# Patient Record
Sex: Female | Born: 1945 | Race: White | Hispanic: No | Marital: Married | Smoking: Never smoker
Health system: Southern US, Community
[De-identification: ages and names within clinical notes are randomized; demographics above are authoritative.]

## PROBLEM LIST (undated history)

## (undated) DIAGNOSIS — E7800 Pure hypercholesterolemia, unspecified: Secondary | ICD-10-CM

## (undated) DIAGNOSIS — E118 Type 2 diabetes mellitus with unspecified complications: Secondary | ICD-10-CM

## (undated) DIAGNOSIS — J189 Pneumonia, unspecified organism: Secondary | ICD-10-CM

## (undated) DIAGNOSIS — G4730 Sleep apnea, unspecified: Secondary | ICD-10-CM

## (undated) DIAGNOSIS — Q839 Congenital malformation of breast, unspecified: Secondary | ICD-10-CM

## (undated) DIAGNOSIS — I2510 Atherosclerotic heart disease of native coronary artery without angina pectoris: Secondary | ICD-10-CM

## (undated) DIAGNOSIS — G43909 Migraine, unspecified, not intractable, without status migrainosus: Secondary | ICD-10-CM

## (undated) DIAGNOSIS — K219 Gastro-esophageal reflux disease without esophagitis: Secondary | ICD-10-CM

## (undated) DIAGNOSIS — G4733 Obstructive sleep apnea (adult) (pediatric): Secondary | ICD-10-CM

## (undated) DIAGNOSIS — E2839 Other primary ovarian failure: Secondary | ICD-10-CM

## (undated) DIAGNOSIS — M810 Age-related osteoporosis without current pathological fracture: Secondary | ICD-10-CM

## (undated) DIAGNOSIS — G4731 Primary central sleep apnea: Secondary | ICD-10-CM

## (undated) DIAGNOSIS — J309 Allergic rhinitis, unspecified: Secondary | ICD-10-CM

## (undated) DIAGNOSIS — E782 Mixed hyperlipidemia: Secondary | ICD-10-CM

## (undated) DIAGNOSIS — F5104 Psychophysiologic insomnia: Secondary | ICD-10-CM

## (undated) DIAGNOSIS — E781 Pure hyperglyceridemia: Secondary | ICD-10-CM

## (undated) DIAGNOSIS — N183 Chronic kidney disease, stage 3 unspecified: Secondary | ICD-10-CM

## (undated) DIAGNOSIS — M1990 Unspecified osteoarthritis, unspecified site: Secondary | ICD-10-CM

## (undated) DIAGNOSIS — M26629 Arthralgia of temporomandibular joint, unspecified side: Secondary | ICD-10-CM

## (undated) DIAGNOSIS — E559 Vitamin D deficiency, unspecified: Secondary | ICD-10-CM

## (undated) DIAGNOSIS — C801 Malignant (primary) neoplasm, unspecified: Secondary | ICD-10-CM

## (undated) DIAGNOSIS — I10 Essential (primary) hypertension: Secondary | ICD-10-CM

## (undated) HISTORY — DX: Type 2 diabetes mellitus with unspecified complications: E11.8

## (undated) HISTORY — DX: Chronic kidney disease, stage 3 unspecified: N18.30

## (undated) HISTORY — PX: ECTOPIC PREGNANCY SURGERY: SHX613

## (undated) HISTORY — DX: Obstructive sleep apnea (adult) (pediatric): G47.33

## (undated) HISTORY — PX: BREAST SURGERY: SHX581

## (undated) HISTORY — DX: Primary central sleep apnea: G47.31

## (undated) HISTORY — DX: Pure hypercholesterolemia, unspecified: E78.00

## (undated) HISTORY — DX: Gastro-esophageal reflux disease without esophagitis: K21.9

## (undated) HISTORY — DX: Pure hyperglyceridemia: E78.1

## (undated) HISTORY — DX: Mixed hyperlipidemia: E78.2

## (undated) HISTORY — DX: Migraine, unspecified, not intractable, without status migrainosus: G43.909

## (undated) HISTORY — DX: Essential (primary) hypertension: I10

## (undated) HISTORY — DX: Psychophysiologic insomnia: F51.04

## (undated) HISTORY — PX: BLADDER SURGERY: SHX569

## (undated) HISTORY — DX: Age-related osteoporosis without current pathological fracture: M81.0

## (undated) HISTORY — DX: Vitamin D deficiency, unspecified: E55.9

## (undated) HISTORY — DX: Congenital malformation of breast, unspecified: Q83.9

## (undated) HISTORY — DX: Atherosclerotic heart disease of native coronary artery without angina pectoris: I25.10

## (undated) HISTORY — DX: Allergic rhinitis, unspecified: J30.9

## (undated) HISTORY — PX: EYE SURGERY: SHX253

## (undated) HISTORY — DX: Other primary ovarian failure: E28.39

## (undated) HISTORY — PX: BREAST REDUCTION SURGERY: SHX8

## (undated) HISTORY — DX: Arthralgia of temporomandibular joint, unspecified side: M26.629

---

## 2009-08-09 ENCOUNTER — Emergency Department (HOSPITAL_COMMUNITY): Admission: EM | Admit: 2009-08-09 | Discharge: 2009-08-09 | Payer: Self-pay | Admitting: Emergency Medicine

## 2011-03-10 LAB — BASIC METABOLIC PANEL
Chloride: 104 mEq/L (ref 96–112)
GFR calc Af Amer: >60 mL/min (ref >60)
Potassium: 4.2 mEq/L (ref 3.5–5.1)

## 2011-03-10 LAB — DIFFERENTIAL
Eosinophils Absolute: 0.2 10*3/uL (ref 0.0–0.7)
Eosinophils Relative: 2 % (ref 0–5)
Lymphs Abs: 4 10*3/uL (ref 0.7–4.0)
Monocytes Absolute: 0.5 10*3/uL (ref 0.1–1.0)
Monocytes Relative: 6 % (ref 3–12)

## 2011-03-10 LAB — CBC
HCT: 40.5 % (ref 36.0–46.0)
Hemoglobin: 14 g/dL (ref 12.0–15.0)
MCV: 90 fL (ref 78.0–100.0)
RBC: 4.51 MIL/uL (ref 3.87–5.11)
WBC: 8.8 10*3/uL (ref 4.0–10.5)

## 2011-03-10 LAB — URINALYSIS, ROUTINE W REFLEX MICROSCOPIC
Glucose, UA: NEGATIVE mg/dL
Hgb urine dipstick: NEGATIVE
Ketones, ur: NEGATIVE mg/dL
Protein, ur: NEGATIVE mg/dL

## 2013-01-15 ENCOUNTER — Ambulatory Visit: Payer: Self-pay | Admitting: Gastroenterology

## 2013-01-15 ENCOUNTER — Telehealth: Payer: Self-pay | Admitting: Gastroenterology

## 2013-01-15 NOTE — Telephone Encounter (Signed)
Pt was a no show

## 2013-02-06 ENCOUNTER — Ambulatory Visit (INDEPENDENT_AMBULATORY_CARE_PROVIDER_SITE_OTHER): Payer: Medicare Other | Admitting: Gastroenterology

## 2013-02-06 ENCOUNTER — Encounter: Payer: Self-pay | Admitting: Gastroenterology

## 2013-02-06 VITALS — BP 124/72 | HR 66 | Temp 98.4°F | Ht 61.0 in | Wt 138.4 lb

## 2013-02-06 DIAGNOSIS — K219 Gastro-esophageal reflux disease without esophagitis: Secondary | ICD-10-CM

## 2013-02-06 DIAGNOSIS — R1310 Dysphagia, unspecified: Secondary | ICD-10-CM | POA: Insufficient documentation

## 2013-02-06 DIAGNOSIS — Z1211 Encounter for screening for malignant neoplasm of colon: Secondary | ICD-10-CM

## 2013-02-06 MED ORDER — PEG 3350-KCL-NA BICARB-NACL 420 G PO SOLR: 4000.0000 mL | ORAL | Status: DC

## 2013-02-06 NOTE — Patient Instructions (Addendum)
We have scheduled you for a colonoscopy and upper endoscopy with dilation if needed with Dr. Jena Gauss.  Start taking Prilosec each morning, 30 minutes before breakfast.

## 2013-02-06 NOTE — Progress Notes (Signed)
Primary Care Physician:  Craig Staggers, MD Primary Gastroenterologist:  Dr. Jena Gauss   Chief Complaint  Patient presents with  . Colonoscopy    HPI:   [name] is a pleasant 67 year old female who is a self-referral today to schedule a colonoscopy and possible upper endoscopy. She states her last lower GI evaluation was in [place] at least 11 years ago. States she had a torturous colon, internal hemorrhoid. No polyps. She also underwent an EGD at that time due to GERD. States had gastric polyps. History of GERD but no problems currently. Prilosec prn. Occasional solid food dysphagia. No N/V. No abdominal pain. Occasional constipation. Takes a stool softener as needed. States this is a chronic problem. Possible scant hematochezia in the remote past.    Past Medical History  Diagnosis Date  . Hypertension   . Hypercholesterolemia   . GERD (gastroesophageal reflux disease)   . Central sleep apnea     Past Surgical History  Procedure Laterality Date  . Breast reduction surgery    . Ectopic pregnancy surgery    . Vaginal hysterectomy    . Bladder surgery      Current Outpatient Prescriptions  Medication Sig Dispense Refill  . aspirin 81 MG tablet Take 81 mg by mouth daily.      Marland Kitchen atorvastatin (LIPITOR) 20 MG tablet Take 20 mg by mouth daily.       . calcium-vitamin D (OSCAL WITH D) 500-200 MG-UNIT per tablet Take 1 tablet by mouth daily.      . fish oil-omega-3 fatty acids 1000 MG capsule Take 2 g by mouth daily.      Marland Kitchen losartan (COZAAR) 100 MG tablet Take 100 mg by mouth daily.       Marland Kitchen omeprazole (PRILOSEC) 20 MG capsule Take 20 mg by mouth daily.      Marland Kitchen PREMARIN 0.625 MG tablet Take 0.625 mg by mouth daily.        No current facility-administered medications for this visit.    Allergies as of 02/06/2013  . (No Known Allergies)    Family History  Problem Relation Age of Onset  . Colon cancer Neg Hx     History   Social History  . Marital Status: Married   Spouse Name: N/A    Number of Children: N/A  . Years of Education: N/A   Occupational History  . retired     Interior and spatial designer   Social History Main Topics  . Smoking status: Never Smoker   . Smokeless tobacco: Not on file  . Alcohol Use: Yes     Comment: red wine very little  . Drug Use: No  . Sexually Active: Not on file   Other Topics Concern  . Not on file   Social History Narrative  . No narrative on file    Review of Systems: Gen: Denies any fever, chills, fatigue, weight loss, lack of appetite.  CV: Denies chest pain, heart palpitations, peripheral edema, syncope.  Resp: Denies shortness of breath at rest or with exertion. Denies wheezing or cough.  GI: Denies dysphagia or odynophagia. Denies jaundice, hematemesis, fecal incontinence. GU : Denies urinary burning, urinary frequency, urinary hesitancy MS: Denies joint pain, muscle weakness, cramps, or limitation of movement.  Derm: Denies rash, itching, dry skin Psych: Denies depression, anxiety, memory loss, and confusion Heme: Denies bruising, bleeding, and enlarged lymph nodes.  Physical Exam: BP 124/72  Pulse 66  Temp(Src) 98.4 F (36.9 C) (Oral)  Ht 5\' 1"  (1.549 m)  Wt 138 lb  6.4 oz (62.778 kg)  BMI 26.16 kg/m2 General:   Alert and oriented. Pleasant and cooperative. Well-nourished and well-developed.  Head:  Normocephalic and atraumatic. Eyes:  Without icterus, sclera clear and conjunctiva pink.  Ears:  Normal auditory acuity. Nose:  No deformity, discharge,  or lesions. Mouth:  No deformity or lesions, oral mucosa pink.  Neck:  Supple, without mass or thyromegaly. Lungs:  Clear to auscultation bilaterally. No wheezes, rales, or rhonchi. No distress.  Heart:  S1, S2 present without murmurs appreciated.  Abdomen:  +BS, soft, non-tender and non-distended. No HSM noted. No guarding or rebound. No masses appreciated.  Rectal:  Deferred  Msk:  Symmetrical without gross deformities. Normal posture.. Extremities:   Without clubbing or edema. Neurologic:  Alert and  oriented x4;  grossly normal neurologically. Skin:  Intact without significant lesions or rashes. Cervical Nodes:  No significant cervical adenopathy. Psych:  Alert and cooperative. Normal mood and affect.

## 2013-02-07 NOTE — Patient Instructions (Addendum)
Sandra Leblanc  02/07/2013   Your procedure is scheduled on:   3/13/204  Report to [facility] at  730  AM.  Call this number if you have problems the morning of surgery: [phone]   Remember:   Do not eat food or drink liquids after midnight.   Take these medicines the morning of surgery with A SIP OF WATER: cozaar,prilosec   Do not wear jewelry, make-up or nail polish.  Do not wear lotions, powders, or perfumes.   Do not shave 48 hours prior to surgery. Men may shave face and neck.  Do not bring valuables to the hospital.  Contacts, dentures or bridgework may not be worn into surgery.  Leave suitcase in the car. After surgery it may be brought to your room.  For patients admitted to the hospital, checkout time is 11:00 AM the day of discharge.   Patients discharged the day of surgery will not be allowed to drive  home.  Name and phone number of your driver: family  Special Instructions: N/A   Please read over the following fact sheets that you were given: Pain Booklet, Coughing and Deep Breathing, Surgical Site Infection Prevention, Anesthesia Post-op Instructions and Care and Recovery After Surgery Esophageal Dilatation The esophagus is the long, narrow tube which carries food and liquid from the mouth to the stomach. Esophageal dilatation is the technique used to stretch a blocked or narrowed portion of the esophagus. This procedure is used when a part of the esophagus has become so narrow that it becomes difficult, painful or even impossible to swallow. This is generally an uncomplicated form of treatment. When this is not successful, chest surgery may be required. This is a much more extensive form of treatment with a longer recovery time. CAUSES  Some of the more common causes of blockage or strictures of the esophagus are:  Narrowing from longstanding inflammation (soreness and redness) of the lower esophagus. This comes from the constant exposure of the lower  esophagus to the acid which bubbles up from the stomach. Over time this causes scarring and narrowing of the lower esophagus.  Hiatal hernia in which a small part of the stomach bulges (herniates) up through the diaphragm. This can cause a gradual narrowing of the end of the esophagus.  Schatzki's Ring is a narrow ring of benign (non-cancerous) fibrous tissue which constricts the lower esophagus. The reason for this is not known.  Scleroderma is a connective tissue disorder that affects the esophagus and makes swallowing difficult.  Achalasia is an absence of nerves to the lower esophagus and to the esophageal sphincter. This is the circular muscle between the stomach and esophagus that relaxes to allow food into the stomach. After swallowing, it contracts to keep food in the stomach. This absence of nerves may be congenital (present since birth). This can cause irregular spasms of the lower esophageal muscle. This spasm does not open up to allow food and fluid through. The result is a persistent blockage with subsequent slow trickling of the esophageal contents into the stomach.  Strictures may develop from swallowing materials which damage the esophagus. Some examples are strong acids or alkalis such as lye.  Growths such as benign (non-cancerous) and malignant (cancerous) tumors can block the esophagus.  Heredity (present since birth) causes. DIAGNOSIS  Your caregiver often suspects this problem by taking a medical history. They will also do a physical exam. They can then prove their suspicions using  X-rays and endoscopy. Endoscopy is an exam in which a tube like a small flexible telescope is used to look at your esophagus.  TREATMENT There are different stretching (dilating) techniques which can be used. Simple bougie dilatation may be done in the office. This usually takes only a couple minutes. A numbing (anesthetic) spray of the throat is used. Endoscopy, when done, is done in an endoscopy  suite, under mild sedation. When fluoroscopy is used, the procedure is performed in X-ray. Other techniques require a little longer time. Recovery is usually quick. There is no waiting time to begin eating and drinking to test success of the treatment. Following are some of the methods used. Narrowing of the esophagus is treated by making it bigger. Commonly this is a mechanical problem which can be treated with stretching. This can be done in different ways. Your caregiver will discuss these with you. Some of the means used are:  A series of graduated (increasing thickness) flexible dilators can be used. These are weighted tubes passed through the esophagus into the stomach. The tubes used become progressively larger until the desired stretched size is reached. Graduated dilators are a simple and quick way of opening the esophagus. No visualization is required.  Another method is the use of endoscopy to place a flexible wire across the stricture. The endoscope is removed and the wire left in place. A dilator with a hole through it from end to end is guided down the esophagus and across the stricture. One or more of these dilators are passed over the wire. At the end of the exam, the wire is removed. This type of treatment may be performed in the X-ray department under fluoroscopy. An advantage of this procedure is the examiner is visualizing the end opening in the esophagus.  Stretching of the esophagus may be done using balloons. Deflated balloons are placed through the endoscope and across the stricture. This type of balloon dilatation is often done at the time of endoscopy or fluoroscopy. Flexible endoscopy allows the examiner to directly view the stricture. A balloon is inserted in the deflated form into the area of narrowing. It is then inflated with air to a certain pressure that is pre-set for a given circumference. When inflated, it becomes sausage shaped, stretched, and makes the stricture  larger.  Achalasia requires a longer larger balloon-type dilator. This is frequently done under X-ray control. In this situation, the spastic muscle fibers in the lower esophagus are stretched. All of the above procedures make the passage of food and water into the stomach easier. They also make it easier for stomach contents to reflux back into the esophagus. Special medications may be used following the procedure to help prevent further stricturing. Proton-pump inhibitor medications are good at decreasing the amount of acid in the stomach juice. When stomach juice refluxes into the esophagus, the juice is no longer as acidic and is less likely to burn or scar the esophagus. RISKS AND COMPLICATIONS Esophageal dilatation is usually performed effectively and without problems. Some complications that can occur are:  A small amount of bleeding almost always happens where the stretching takes place. If this is too excessive it may require more aggressive treatment.  An uncommon complication is perforation (making a hole) of the esophagus. The esophagus is thin. It is easy to make a hole in it. If this happens, an operation may be necessary to repair this.  A small, undetected perforation could lead to an infection in the chest. This can  be very serious. HOME CARE INSTRUCTIONS   If you received sedation for your procedure, do not drive, make important decisions, or perform any activities requiring your full coordination. Do not drink alcohol, take sedatives, or use any mind altering chemicals unless instructed by your caregiver.  You may use throat lozenges or warm salt water gargles if you have throat discomfort  You can begin eating and drinking normally on return home unless instructed otherwise. Do not purposely try to force large chunks of food down to test the benefits of your procedure.  Mild discomfort can be eased with sips of ice water.  Medications for discomfort may or may not be  needed. SEEK IMMEDIATE MEDICAL CARE IF:   You begin vomiting up blood.  You develop black tarry stools  You develop chills or an unexplained temperature of over 101 F (38.3 C)  You develop chest or abdominal pain.  You develop shortness of breath or feel lightheaded or faint.  Your swallowing is becoming more painful, difficult, or you are unable to swallow. MAKE SURE YOU:   Understand these instructions.  Will watch your condition.  Will get help right away if you are not doing well or get worse. Document Released: 01/11/2006 Document Revised: 02/12/2012 Document Reviewed: 02/28/2006 [facility] Patient Information 2013 [place], [place]. Colonoscopy A colonoscopy is an exam to evaluate your entire colon. In this exam, your colon is cleansed. A long fiberoptic tube is inserted through your rectum and into your colon. The fiberoptic scope (endoscope) is a long bundle of enclosed and very flexible fibers. These fibers transmit light to the area examined and send images from that area to your caregiver. Discomfort is usually minimal. You may be given a drug to help you sleep (sedative) during or prior to the procedure. This exam helps to detect lumps (tumors), polyps, inflammation, and areas of bleeding. Your caregiver may also take a small piece of tissue (biopsy) that will be examined under a microscope. LET YOUR CAREGIVER KNOW ABOUT:   Allergies to food or medicine.  Medicines taken, including vitamins, herbs, eyedrops, over-the-counter medicines, and creams.  Use of steroids (by mouth or creams).  Previous problems with anesthetics or numbing medicines.  History of bleeding problems or blood clots.  Previous surgery.  Other health problems, including diabetes and kidney problems.  Possibility of pregnancy, if this applies. BEFORE THE PROCEDURE   A clear liquid diet may be required for 2 days before the exam.  Ask your caregiver about changing or stopping your regular  medications.  Liquid injections (enemas) or laxatives may be required.  A large amount of electrolyte solution may be given to you to drink over a short period of time. This solution is used to clean out your colon.  You should be present 60 minutes prior to your procedure or as directed by your caregiver. AFTER THE PROCEDURE   If you received a sedative or pain relieving medication, you will need to arrange for someone to drive you home.  Occasionally, there is a little blood passed with the first bowel movement. Do not be concerned. FINDING OUT THE RESULTS OF YOUR TEST Not all test results are available during your visit. If your test results are not back during the visit, make an appointment with your caregiver to find out the results. Do not assume everything is normal if you have not heard from your caregiver or the medical facility. It is important for you to follow up on all of your test results. HOME  CARE INSTRUCTIONS   It is not unusual to pass moderate amounts of gas and experience mild abdominal cramping following the procedure. This is due to air being used to inflate your colon during the exam. Walking or a warm pack on your belly (abdomen) may help.  You may resume all normal meals and activities after sedatives and medicines have worn off.  Only take over-the-counter or prescription medicines for pain, discomfort, or fever as directed by your caregiver. Do not use aspirin or blood thinners if a biopsy was taken. Consult your caregiver for medicine usage if biopsies were taken. SEEK IMMEDIATE MEDICAL CARE IF:   You have a fever.  You pass large blood clots or fill a toilet with blood following the procedure. This may also occur 10 to 14 days following the procedure. This is more likely if a biopsy was taken.  You develop abdominal pain that keeps getting worse and cannot be relieved with medicine. Document Released: 11/17/2000 Document Revised: 02/12/2012 Document Reviewed:  07/02/2008 [facility] Patient Information 2013 [place], [place]. Esophagogastroduodenoscopy This is an endoscopic procedure (a procedure that uses a device like a flexible telescope) that allows your caregiver to view the upper stomach and small bowel. This test allows your caregiver to look at the esophagus. The esophagus carries food from your mouth to your stomach. They can also look at your duodenum. This is the first part of the small intestine that attaches to the stomach. This test is used to detect problems in the bowel such as ulcers and inflammation. PREPARATION FOR TEST Nothing to eat after midnight the day before the test. NORMAL FINDINGS Normal esophagus, stomach, and duodenum. Ranges for normal findings may vary among different laboratories and hospitals. You should always check with your doctor after having lab work or other tests done to discuss the meaning of your test results and whether your values are considered within normal limits. MEANING OF TEST  Your caregiver will go over the test results with you and discuss the importance and meaning of your results, as well as treatment options and the need for additional tests if necessary. OBTAINING THE TEST RESULTS It is your responsibility to obtain your test results. Ask the lab or department performing the test when and how you will get your results. Document Released: 03/23/2005 Document Revised: 02/12/2012 Document Reviewed: 10/30/2008 [facility] Patient Information 2013 [place], [place]. PATIENT INSTRUCTIONS POST-ANESTHESIA  IMMEDIATELY FOLLOWING SURGERY:  Do not drive or operate machinery for the first twenty four hours after surgery.  Do not make any important decisions for twenty four hours after surgery or while taking narcotic pain medications or sedatives.  If you develop intractable nausea and vomiting or a severe headache please notify your doctor immediately.  FOLLOW-UP:  Please make an appointment with your surgeon as  instructed. You do not need to follow up with anesthesia unless specifically instructed to do so.  WOUND CARE INSTRUCTIONS (if applicable):  Keep a dry clean dressing on the anesthesia/puncture wound site if there is drainage.  Once the wound has quit draining you may leave it open to air.  Generally you should leave the bandage intact for twenty four hours unless there is drainage.  If the epidural site drains for more than 36-48 hours please call the anesthesia department.  QUESTIONS?:  Please feel free to call your physician or the hospital operator if you have any questions, and they will be happy to assist you.

## 2013-02-07 NOTE — Assessment & Plan Note (Signed)
67 year old pleasant female who is due for routine screening colonoscopy. Her last lower GI evaluation was in [place] at least 11 years ago. We will attempt to retrieve this records. According to her, she notes a torturous colon, no polyps, internal hemorrhoids. She states she was awake during the procedure and experienced a lot of pain. She is requesting to be completely sedated due to history of failed sedation.   Proceed with TCS with Dr. Jena Gauss in near future: the risks, benefits, and alternatives have been discussed with the patient in detail. The patient states understanding and desires to proceed. Propofol due to history of failed sedation

## 2013-02-07 NOTE — Assessment & Plan Note (Addendum)
Solid-food dysphagia noted, no prior dilations that are known. Her last EGD was at least 11 years ago at the time of TCS. Notes she had gastric polyps at that time. May be dealing with uncontrolled GERD, need to evaluate for occult web, ring, or stricture.   Proceed with upper endoscopy and dilation in the near future with Dr. Jena Gauss. The risks, benefits, and alternatives have been discussed in detail with patient. They have stated understanding and desire to proceed.  Propofol due to history of failed sedation.

## 2013-02-07 NOTE — Assessment & Plan Note (Signed)
Chronic, only taking Prilosec prn. Start taking daily. See dysphagia.

## 2013-02-10 ENCOUNTER — Other Ambulatory Visit: Payer: Self-pay

## 2013-02-10 ENCOUNTER — Encounter (HOSPITAL_COMMUNITY): Payer: Self-pay | Admitting: Pharmacy Technician

## 2013-02-10 ENCOUNTER — Encounter (HOSPITAL_COMMUNITY)
Admission: RE | Admit: 2013-02-10 | Discharge: 2013-02-10 | Disposition: A | Discharge status: Home / Self Care | Payer: Medicare Other | Source: Ambulatory Visit | Attending: Internal Medicine | Admitting: Internal Medicine

## 2013-02-10 ENCOUNTER — Encounter (HOSPITAL_COMMUNITY): Payer: Self-pay

## 2013-02-10 HISTORY — DX: Sleep apnea, unspecified: G47.30

## 2013-02-10 HISTORY — DX: Unspecified osteoarthritis, unspecified site: M19.90

## 2013-02-10 LAB — BASIC METABOLIC PANEL
BUN: 14 mg/dL (ref 6–23)
Chloride: 101 mEq/L (ref 96–112)
GFR calc Af Amer: >90 mL/min (ref >90)
Potassium: 4.3 mEq/L (ref 3.5–5.1)

## 2013-02-10 LAB — HEMOGLOBIN AND HEMATOCRIT, BLOOD: Hemoglobin: 13.6 g/dL (ref 12.0–15.0)

## 2013-02-10 NOTE — Progress Notes (Signed)
Faxed to PCP

## 2013-02-13 ENCOUNTER — Encounter (HOSPITAL_COMMUNITY)
Admission: RE | Disposition: A | Discharge status: Home / Self Care | Payer: Self-pay | Source: Ambulatory Visit | Attending: Internal Medicine

## 2013-02-13 ENCOUNTER — Encounter (HOSPITAL_COMMUNITY): Payer: Self-pay | Admitting: *Deleted

## 2013-02-13 ENCOUNTER — Ambulatory Visit (HOSPITAL_COMMUNITY): Payer: Medicare Other | Admitting: Anesthesiology

## 2013-02-13 ENCOUNTER — Encounter (HOSPITAL_COMMUNITY): Payer: Self-pay | Admitting: Anesthesiology

## 2013-02-13 ENCOUNTER — Ambulatory Visit (HOSPITAL_COMMUNITY)
Admission: RE | Admit: 2013-02-13 | Discharge: 2013-02-13 | Disposition: A | Discharge status: Home / Self Care | Payer: Medicare Other | Source: Ambulatory Visit | Attending: Internal Medicine | Admitting: Internal Medicine

## 2013-02-13 DIAGNOSIS — D126 Benign neoplasm of colon, unspecified: Secondary | ICD-10-CM

## 2013-02-13 DIAGNOSIS — Z1211 Encounter for screening for malignant neoplasm of colon: Secondary | ICD-10-CM

## 2013-02-13 DIAGNOSIS — K5730 Diverticulosis of large intestine without perforation or abscess without bleeding: Secondary | ICD-10-CM | POA: Insufficient documentation

## 2013-02-13 DIAGNOSIS — Z01810 Encounter for preprocedural cardiovascular examination: Secondary | ICD-10-CM | POA: Insufficient documentation

## 2013-02-13 DIAGNOSIS — Z01812 Encounter for preprocedural laboratory examination: Secondary | ICD-10-CM | POA: Insufficient documentation

## 2013-02-13 DIAGNOSIS — R1310 Dysphagia, unspecified: Secondary | ICD-10-CM

## 2013-02-13 DIAGNOSIS — I10 Essential (primary) hypertension: Secondary | ICD-10-CM | POA: Insufficient documentation

## 2013-02-13 DIAGNOSIS — D131 Benign neoplasm of stomach: Secondary | ICD-10-CM

## 2013-02-13 DIAGNOSIS — K2100 Gastro-esophageal reflux disease with esophagitis, without bleeding: Secondary | ICD-10-CM | POA: Insufficient documentation

## 2013-02-13 HISTORY — PX: MALONEY DILATION: SHX5535

## 2013-02-13 HISTORY — PX: ESOPHAGOGASTRODUODENOSCOPY (EGD) WITH PROPOFOL: SHX5813

## 2013-02-13 HISTORY — PX: COLONOSCOPY WITH PROPOFOL: SHX5780

## 2013-02-13 SURGERY — COLONOSCOPY WITH PROPOFOL
Anesthesia: Monitor Anesthesia Care

## 2013-02-13 MED ORDER — LACTATED RINGERS IV SOLN
INTRAVENOUS | Status: DC
  Administered 2013-02-13: 09:00:00 via INTRAVENOUS

## 2013-02-13 MED ORDER — PROPOFOL INFUSION 10 MG/ML OPTIME
INTRAVENOUS | Status: DC | PRN
  Administered 2013-02-13: 75 ug/kg/min via INTRAVENOUS

## 2013-02-13 MED ORDER — MIDAZOLAM HCL 2 MG/2ML IJ SOLN
1.0000 mg | INTRAMUSCULAR | Status: DC | PRN
Start: 2013-02-13 — End: 2013-02-13
  Administered 2013-02-13: 2 mg via INTRAVENOUS

## 2013-02-13 MED ORDER — STERILE WATER FOR IRRIGATION IR SOLN
Status: DC | PRN
  Administered 2013-02-13: 10:00:00

## 2013-02-13 MED ORDER — FENTANYL CITRATE 0.05 MG/ML IJ SOLN: 25.0000 ug | INTRAMUSCULAR | Status: DC | PRN

## 2013-02-13 MED ORDER — ONDANSETRON HCL 4 MG/2ML IJ SOLN: 4.0000 mg | Freq: Once | INTRAMUSCULAR | Status: DC | PRN

## 2013-02-13 MED ORDER — MIDAZOLAM HCL 2 MG/2ML IJ SOLN
INTRAMUSCULAR | Status: AC
  Filled 2013-02-13: qty 2

## 2013-02-13 MED ORDER — MIDAZOLAM HCL 5 MG/5ML IJ SOLN
INTRAMUSCULAR | Status: DC | PRN
  Administered 2013-02-13 (×2): 1 mg via INTRAVENOUS

## 2013-02-13 MED ORDER — GLYCOPYRROLATE 0.2 MG/ML IJ SOLN
0.2000 mg | Freq: Once | INTRAMUSCULAR | Status: AC
  Administered 2013-02-13: 0.2 mg via INTRAVENOUS

## 2013-02-13 MED ORDER — ONDANSETRON HCL 4 MG/2ML IJ SOLN
4.0000 mg | Freq: Once | INTRAMUSCULAR | Status: AC
  Administered 2013-02-13: 4 mg via INTRAVENOUS

## 2013-02-13 MED ORDER — BUTAMBEN-TETRACAINE-BENZOCAINE 2-2-14 % EX AERO
1.0000 | INHALATION_SPRAY | Freq: Once | CUTANEOUS | Status: AC
  Administered 2013-02-13: 1 via TOPICAL
  Filled 2013-02-13: qty 56

## 2013-02-13 MED ORDER — GLYCOPYRROLATE 0.2 MG/ML IJ SOLN
INTRAMUSCULAR | Status: AC
  Filled 2013-02-13: qty 1

## 2013-02-13 MED ORDER — FENTANYL CITRATE 0.05 MG/ML IJ SOLN
INTRAMUSCULAR | Status: DC | PRN
  Administered 2013-02-13: 50 ug via INTRAVENOUS
  Administered 2013-02-13 (×2): 25 ug via INTRAVENOUS

## 2013-02-13 MED ORDER — WATER FOR IRRIGATION, STERILE IR SOLN
Status: DC | PRN
  Administered 2013-02-13: 1000 mL

## 2013-02-13 MED ORDER — ONDANSETRON HCL 4 MG/2ML IJ SOLN
INTRAMUSCULAR | Status: AC
  Filled 2013-02-13: qty 2

## 2013-02-13 MED ORDER — PROPOFOL 10 MG/ML IV EMUL
INTRAVENOUS | Status: AC
  Filled 2013-02-13: qty 20

## 2013-02-13 SURGICAL SUPPLY — 26 items
BLOCK BITE 60FR ADLT L/F BLUE (MISCELLANEOUS) ×2 IMPLANT
DEVICE CLIP HEMOSTAT 235CM (CLIP) IMPLANT
ELECT REM PT RETURN 9FT ADLT (ELECTROSURGICAL)
ELECTRODE REM PT RTRN 9FT ADLT (ELECTROSURGICAL) IMPLANT
FCP BXJMBJMB 240X2.8X (CUTTING FORCEPS)
FLOOR PAD 36X40 (MISCELLANEOUS) ×2
FORCEP RJ3 GP 1.8X160 W-NEEDLE (CUTTING FORCEPS) ×1 IMPLANT
FORCEPS BIOP RAD 4 LRG CAP 4 (CUTTING FORCEPS) ×3 IMPLANT
FORCEPS BIOP RJ4 240 W/NDL (CUTTING FORCEPS)
FORCEPS BXJMBJMB 240X2.8X (CUTTING FORCEPS) IMPLANT
INJECTOR/SNARE I SNARE (MISCELLANEOUS) IMPLANT
LUBRICANT JELLY 4.5OZ STERILE (MISCELLANEOUS) ×1 IMPLANT
MANIFOLD NEPTUNE II (INSTRUMENTS) ×1 IMPLANT
MANIFOLD NEPTUNE WASTE (CANNULA) IMPLANT
NDL SCLEROTHERAPY 25GX240 (NEEDLE) ×1 IMPLANT
NEEDLE SCLEROTHERAPY 25GX240 (NEEDLE) ×2 IMPLANT
PAD FLOOR 36X40 (MISCELLANEOUS) ×1 IMPLANT
PROBE APC STR FIRE (PROBE) ×2 IMPLANT
PROBE INJECTION GOLD (MISCELLANEOUS) ×2
PROBE INJECTION GOLD 7FR (MISCELLANEOUS) ×1 IMPLANT
SNARE ROTATE MED OVAL 20MM (MISCELLANEOUS) ×2 IMPLANT
SYR 50ML LL SCALE MARK (SYRINGE) ×1 IMPLANT
TRAP SPECIMEN MUCOUS 40CC (MISCELLANEOUS) IMPLANT
TUBING ENDO SMARTCAP PENTAX (MISCELLANEOUS) ×2 IMPLANT
TUBING IRRIGATION ENDOGATOR (MISCELLANEOUS) ×2 IMPLANT
WATER STERILE IRR 1000ML POUR (IV SOLUTION) ×2 IMPLANT

## 2013-02-13 NOTE — Interval H&P Note (Signed)
History and Physical Interval Note:  02/13/2013 9:22 AM  Sandra Leblanc  has presented today for surgery, with the diagnosis of Screening TCS , GERD, Dysphagia Done in the OR  The various methods of treatment have been discussed with the patient and family. After consideration of risks, benefits and other options for treatment, the patient has consented to  Procedure(s) with comments: COLONOSCOPY WITH PROPOFOL (N/A) - 9:00 ESOPHAGOGASTRODUODENOSCOPY (EGD) WITH PROPOFOL (N/A) SAVORY DILATION (N/A) MALONEY DILATION (N/A) as a surgical intervention .  The patient's history has been reviewed, patient examined, no change in status, stable for surgery.  I have reviewed the patient's chart and labs.  Questions were answered to the patient's satisfaction.     Robert Rourk EGD and colonoscopy per plan.The risks, benefits, limitations, imponderables and alternatives regarding both EGD and colonoscopy have been reviewed with the patient. Questions have been answered. All parties agreeable.

## 2013-02-13 NOTE — Op Note (Addendum)
[facility] [address] [place], [address]   ENDOSCOPY PROCEDURE REPORT  PATIENT: Sandra Leblanc, Sandra Leblanc  MR#: [id number] BIRTHDATE: February 11, 1946 , 66  yrs. old GENDER: Female ENDOSCOPIST: R.  Roetta Sessions, MD FACP [facility] REFERRED BY:  Dr. Kandice Moos PROCEDURE DATE:  02/13/2013 PROCEDURE:     EGD with Elease Hashimoto dilation followed by gastric biopsy  INDICATIONS:     History of GERD; esophageal dysphagia  INFORMED CONSENT:   The risks, benefits, limitations, alternatives and imponderables have been discussed.  The potential for biopsy, esophogeal dilation, etc. have also been reviewed.  Questions have been answered.  All parties agreeable.  Please see the history and physical in the medical record for more information.  MEDICATIONS:    deep sedation per Dr. [facility]  DESCRIPTION OF PROCEDURE:   The     endoscope was introduced through the mouth and advanced to the second portion of the duodenum without difficulty or limitations.  The mucosal surfaces were surveyed very carefully during advancement of the scope and upon withdrawal.  Retroflexion view of the proximal stomach and esophagogastric junction was performed.      FINDINGS: Couple of tiny distal esophageal erosions within 5 mm of the GE junction. Patent tubular esophagus throughout its course. No Barrett's esophagus. Stomach empty. Small hiatal hernia. Multiple 2-5 mm benign-appearing gastric polyps; Otherwise, the remainder the gastric mucosa appeared normal. Patent pylorus. Normal first and second portion of the duodenum  THERAPEUTIC / DIAGNOSTIC MANEUVERS PERFORMED:  A 54 French Maloney dilator was passed to full insertion easily. A look back revealed no apparent complication related to this maneuver. Subsequently, biopsies of one of the gastric polyps taken for histologic study.   COMPLICATIONS:  None  IMPRESSION:  Mild erosive reflux esophagitis; Status post passage of a Maloney dilator.  Hiatal hernia. Benign-appearing gastric polyps-status post biopsy  RECOMMENDATIONS:   Increase omeprazole to 40 mg daily for now. Followup on pathology. See colonoscopy report.    _______________________________ R. Roetta Sessions, MD FACP [facility] eSigned:  R. Roetta Sessions, MD FACP [facility] 02/13/2013 10:46 AM     CC:

## 2013-02-13 NOTE — Anesthesia Preprocedure Evaluation (Signed)
Anesthesia Evaluation  Patient identified by MRN, date of birth, ID band Patient awake    Reviewed: Allergy & Precautions, H&P , NPO status , Patient's Chart, lab work & pertinent test results  History of Anesthesia Complications Negative for: history of anesthetic complications  Airway Mallampati: II TM Distance: >3 FB     Dental  (+) Teeth Intact   Pulmonary sleep apnea and Continuous Positive Airway Pressure Ventilation ,  breath sounds clear to auscultation        Cardiovascular hypertension, Pt. on medications Rhythm:Regular Rate:Normal     Neuro/Psych    GI/Hepatic GERD-  Medicated and Controlled,  Endo/Other    Renal/GU      Musculoskeletal   Abdominal   Peds  Hematology   Anesthesia Other Findings   Reproductive/Obstetrics                           Anesthesia Physical Anesthesia Plan  ASA: II  Anesthesia Plan: MAC   Post-op Pain Management:    Induction: Intravenous  Airway Management Planned: Simple Face Mask  Additional Equipment:   Intra-op Plan:   Post-operative Plan:   Informed Consent: I have reviewed the patients History and Physical, chart, labs and discussed the procedure including the risks, benefits and alternatives for the proposed anesthesia with the patient or authorized representative who has indicated his/her understanding and acceptance.     Plan Discussed with:   Anesthesia Plan Comments:         Anesthesia Quick Evaluation

## 2013-02-13 NOTE — Transfer of Care (Signed)
Immediate Anesthesia Transfer of Care Note  Patient: Sandra Leblanc  Procedure(s) Performed: Procedure(s) with comments: COLONOSCOPY WITH PROPOFOL (N/A) - start [phone]; in cecum at 1003 ; out of cecum at 1020  ; total time = 17 minutes ESOPHAGOGASTRODUODENOSCOPY (EGD) WITH PROPOFOL (N/A) - end 0948 MALONEY DILATION (N/A) - 54mm  Patient Location: PACU  Anesthesia Type:MAC  Level of Consciousness: awake, alert  and oriented  Airway & Oxygen Therapy: Patient Spontanous Breathing  Post-op Assessment: Report given to PACU RN  Post vital signs: Reviewed and stable  Complications: No apparent anesthesia complications

## 2013-02-13 NOTE — H&P (View-Only) (Signed)
Primary Care Physician:  VASIREDDY,SABITHA, MD Primary Gastroenterologist:  Dr. Rourk   Chief Complaint  Patient presents with  . Colonoscopy    HPI:   Ms. Brogan is a pleasant 67-year-old female who is a self-referral today to schedule a colonoscopy and possible upper endoscopy. She states her last lower GI evaluation was in [place] at least 11 years ago. States she had a torturous colon, internal hemorrhoid. No polyps. She also underwent an EGD at that time due to GERD. States had gastric polyps. History of GERD but no problems currently. Prilosec prn. Occasional solid food dysphagia. No N/V. No abdominal pain. Occasional constipation. Takes a stool softener as needed. States this is a chronic problem. Possible scant hematochezia in the remote past.    Past Medical History  Diagnosis Date  . Hypertension   . Hypercholesterolemia   . GERD (gastroesophageal reflux disease)   . Central sleep apnea     Past Surgical History  Procedure Laterality Date  . Breast reduction surgery    . Ectopic pregnancy surgery    . Vaginal hysterectomy    . Bladder surgery      Current Outpatient Prescriptions  Medication Sig Dispense Refill  . aspirin 81 MG tablet Take 81 mg by mouth daily.      . atorvastatin (LIPITOR) 20 MG tablet Take 20 mg by mouth daily.       . calcium-vitamin D (OSCAL WITH D) 500-200 MG-UNIT per tablet Take 1 tablet by mouth daily.      . fish oil-omega-3 fatty acids 1000 MG capsule Take 2 g by mouth daily.      . losartan (COZAAR) 100 MG tablet Take 100 mg by mouth daily.       . omeprazole (PRILOSEC) 20 MG capsule Take 20 mg by mouth daily.      . PREMARIN 0.625 MG tablet Take 0.625 mg by mouth daily.        No current facility-administered medications for this visit.    Allergies as of 02/06/2013  . (No Known Allergies)    Family History  Problem Relation Age of Onset  . Colon cancer Neg Hx     History   Social History  . Marital Status: Married   Spouse Name: N/A    Number of Children: N/A  . Years of Education: N/A   Occupational History  . retired     hairdresser   Social History Main Topics  . Smoking status: Never Smoker   . Smokeless tobacco: Not on file  . Alcohol Use: Yes     Comment: red wine very little  . Drug Use: No  . Sexually Active: Not on file   Other Topics Concern  . Not on file   Social History Narrative  . No narrative on file    Review of Systems: Gen: Denies any fever, chills, fatigue, weight loss, lack of appetite.  CV: Denies chest pain, heart palpitations, peripheral edema, syncope.  Resp: Denies shortness of breath at rest or with exertion. Denies wheezing or cough.  GI: Denies dysphagia or odynophagia. Denies jaundice, hematemesis, fecal incontinence. GU : Denies urinary burning, urinary frequency, urinary hesitancy MS: Denies joint pain, muscle weakness, cramps, or limitation of movement.  Derm: Denies rash, itching, dry skin Psych: Denies depression, anxiety, memory loss, and confusion Heme: Denies bruising, bleeding, and enlarged lymph nodes.  Physical Exam: BP 124/72  Pulse 66  Temp(Src) 98.4 F (36.9 C) (Oral)  Ht 5' 1" (1.549 m)  Wt 138 lb   6.4 oz (62.778 kg)  BMI 26.16 kg/m2 General:   Alert and oriented. Pleasant and cooperative. Well-nourished and well-developed.  Head:  Normocephalic and atraumatic. Eyes:  Without icterus, sclera clear and conjunctiva pink.  Ears:  Normal auditory acuity. Nose:  No deformity, discharge,  or lesions. Mouth:  No deformity or lesions, oral mucosa pink.  Neck:  Supple, without mass or thyromegaly. Lungs:  Clear to auscultation bilaterally. No wheezes, rales, or rhonchi. No distress.  Heart:  S1, S2 present without murmurs appreciated.  Abdomen:  +BS, soft, non-tender and non-distended. No HSM noted. No guarding or rebound. No masses appreciated.  Rectal:  Deferred  Msk:  Symmetrical without gross deformities. Normal posture.. Extremities:   Without clubbing or edema. Neurologic:  Alert and  oriented x4;  grossly normal neurologically. Skin:  Intact without significant lesions or rashes. Cervical Nodes:  No significant cervical adenopathy. Psych:  Alert and cooperative. Normal mood and affect.    

## 2013-02-13 NOTE — Anesthesia Postprocedure Evaluation (Signed)
  Anesthesia Post-op Note  Patient: Sandra Leblanc  Procedure(s) Performed: Procedure(s) with comments: COLONOSCOPY WITH PROPOFOL (N/A) - start [phone]; in cecum at 1003 ; out of cecum at 1020  ; total time = 17 minutes ESOPHAGOGASTRODUODENOSCOPY (EGD) WITH PROPOFOL (N/A) - end 0948 MALONEY DILATION (N/A) - 54mm  Patient Location: PACU  Anesthesia Type:MAC  Level of Consciousness: awake, alert  and oriented  Airway and Oxygen Therapy: Patient Spontanous Breathing  Post-op Pain: none  Post-op Assessment: Post-op Vital signs reviewed, Patient's Cardiovascular Status Stable, Respiratory Function Stable, Patent Airway and No signs of Nausea or vomiting  Post-op Vital Signs: Reviewed and stable  Complications: No apparent anesthesia complications

## 2013-02-13 NOTE — Op Note (Signed)
[facility] [address] [place], [address]   COLONOSCOPY PROCEDURE REPORT  PATIENT: Sandra, Leblanc  MR#:         [id number] BIRTHDATE: 1946/05/10 , 66  yrs. old GENDER: Female ENDOSCOPIST: R.  Roetta Sessions, MD FACP [facility] REFERRED BY:  Dr. Heron Nay PROCEDURE DATE:  02/13/2013 PROCEDURE:     Colonoscopy with biopsy  INDICATIONS: Average risk colorectal cancer screening examination  INFORMED CONSENT:  The risks, benefits, alternatives and imponderables including but not limited to bleeding, perforation as well as the possibility of a missed lesion have been reviewed.  The potential for biopsy, lesion removal, etc. have also been discussed.  Questions have been answered.  All parties agreeable. Please see the history and physical in the medical record for more information.  MEDICATIONS: Deep sedation per Dr. [facility]  DESCRIPTION OF PROCEDURE:  After a digital rectal exam was performed, the     colonoscope was advanced from the anus through the rectum and colon to the area of the cecum, ileocecal valve and appendiceal orifice.  The cecum was deeply intubated.  These structures were well-seen and photographed for the record.  From the level of the cecum and ileocecal valve, the scope was slowly and cautiously withdrawn.  The mucosal surfaces were carefully surveyed utilizing scope tip deflection to facilitate fold flattening as needed.  The scope was pulled down into the rectum where a thorough examination including retroflexion was performed.     FINDINGS:  Adequate preparation. Normal rectum. Scattered sigmoid diverticula. (1) 4 mm mid sigmoid polyp; otherwise, the remainder of the colonic mucosa appeared normal.  THERAPEUTIC / DIAGNOSTIC MANEUVERS PERFORMED:  The above-mentioned polyp was cold biopsied/removed.  COMPLICATIONS: None  CECAL WITHDRAWAL TIME:    17 minutes  IMPRESSION:  Sigmoid diverticulosis. Colonic polyp-removed  as described above  RECOMMENDATIONS: Followup on pathology. See EGD report.   _______________________________ eSigned:  R. Roetta Sessions, MD FACP [facility] 02/13/2013 11:08 AM   CC:    PATIENT NAME:  Sandra, Leblanc MR#: [id number]

## 2013-02-14 ENCOUNTER — Encounter (HOSPITAL_COMMUNITY): Payer: Self-pay | Admitting: Internal Medicine

## 2013-02-16 ENCOUNTER — Encounter: Payer: Self-pay | Admitting: Internal Medicine

## 2013-02-17 ENCOUNTER — Encounter: Payer: Self-pay | Admitting: *Deleted

## 2013-09-22 ENCOUNTER — Other Ambulatory Visit: Payer: Self-pay | Admitting: Internal Medicine

## 2014-08-11 ENCOUNTER — Other Ambulatory Visit: Payer: Self-pay

## 2014-08-12 MED ORDER — OMEPRAZOLE 40 MG PO CPDR: DELAYED_RELEASE_CAPSULE | ORAL | Status: AC

## 2017-01-06 ENCOUNTER — Emergency Department (HOSPITAL_COMMUNITY): Payer: Medicare Other

## 2017-01-06 ENCOUNTER — Emergency Department (HOSPITAL_COMMUNITY)
Admission: EM | Admit: 2017-01-06 | Discharge: 2017-01-06 | Disposition: A | Discharge status: Home / Self Care | Payer: Medicare Other | Attending: Emergency Medicine | Admitting: Emergency Medicine

## 2017-01-06 ENCOUNTER — Encounter (HOSPITAL_COMMUNITY): Payer: Self-pay | Admitting: *Deleted

## 2017-01-06 DIAGNOSIS — W1809XA Striking against other object with subsequent fall, initial encounter: Secondary | ICD-10-CM | POA: Diagnosis not present

## 2017-01-06 DIAGNOSIS — S299XXA Unspecified injury of thorax, initial encounter: Secondary | ICD-10-CM | POA: Diagnosis not present

## 2017-01-06 DIAGNOSIS — Z79899 Other long term (current) drug therapy: Secondary | ICD-10-CM | POA: Diagnosis not present

## 2017-01-06 DIAGNOSIS — S298XXA Other specified injuries of thorax, initial encounter: Secondary | ICD-10-CM

## 2017-01-06 DIAGNOSIS — Y929 Unspecified place or not applicable: Secondary | ICD-10-CM | POA: Diagnosis not present

## 2017-01-06 DIAGNOSIS — S0990XA Unspecified injury of head, initial encounter: Secondary | ICD-10-CM | POA: Diagnosis present

## 2017-01-06 DIAGNOSIS — I10 Essential (primary) hypertension: Secondary | ICD-10-CM | POA: Diagnosis not present

## 2017-01-06 DIAGNOSIS — Y999 Unspecified external cause status: Secondary | ICD-10-CM | POA: Insufficient documentation

## 2017-01-06 DIAGNOSIS — S0003XA Contusion of scalp, initial encounter: Secondary | ICD-10-CM | POA: Diagnosis not present

## 2017-01-06 DIAGNOSIS — Y939 Activity, unspecified: Secondary | ICD-10-CM | POA: Diagnosis not present

## 2017-01-06 MED ORDER — TRAMADOL HCL 50 MG PO TABS: 50.0000 mg | ORAL_TABLET | Freq: Four times a day (QID) | ORAL | 0 refills | Status: DC | PRN

## 2017-01-06 MED ORDER — IBUPROFEN 400 MG PO TABS
400.0000 mg | ORAL_TABLET | Freq: Once | ORAL | Status: AC
  Administered 2017-01-06: 400 mg via ORAL
  Filled 2017-01-06: qty 1

## 2017-01-06 MED ORDER — TRAMADOL HCL 50 MG PO TABS
50.0000 mg | ORAL_TABLET | Freq: Once | ORAL | Status: AC
  Administered 2017-01-06: 50 mg via ORAL
  Filled 2017-01-06: qty 1

## 2017-01-06 NOTE — ED Provider Notes (Signed)
[place] DEPT Provider Note   CSN: [id number] Arrival date & time: 01/06/17  0253     History   Chief Complaint Chief Complaint  Patient presents with  . Fall    HPI CHIRSTINA METTEN is a 71 y.o. female.  Patient presents to the emergency para for evaluation after a fall. Patient reports that she fell in the shower at approximately 10:30 PM. She reports that she hit the right side of her head on the wall, no loss of consciousness. She is complaining of persistent headache. She also hit the left side of her ribs when she fell. She is experiencing constant pain in the left ribs that worsens with movement and breathing. No significant shortness of breath. No extremity injury. No midline back pain. No abdominal pain.      Past Medical History:  Diagnosis Date  . Arthritis   . Central sleep apnea   . GERD (gastroesophageal reflux disease)   . Hypercholesterolemia   . Hypertension   . Sleep apnea     Patient Active Problem List   Diagnosis Date Noted  . GERD (gastroesophageal reflux disease) 02/06/2013  . Dysphagia, unspecified(787.20) 02/06/2013  . Encounter for screening colonoscopy 02/06/2013    Past Surgical History:  Procedure Laterality Date  . BLADDER SURGERY    . BREAST REDUCTION SURGERY    . BREAST SURGERY    . COLONOSCOPY WITH PROPOFOL N/A 02/13/2013   Procedure: COLONOSCOPY WITH PROPOFOL;  Surgeon: Daneil Dolin, MD;  Location: [facility];  Service: Endoscopy;  Laterality: N/A;  start 0954; in cecum at 1003 ; out of cecum at 1020  ; total time = 17 minutes  . ECTOPIC PREGNANCY SURGERY    . ESOPHAGOGASTRODUODENOSCOPY (EGD) WITH PROPOFOL N/A 02/13/2013   Procedure: ESOPHAGOGASTRODUODENOSCOPY (EGD) WITH PROPOFOL;  Surgeon: Daneil Dolin, MD;  Location: [facility];  Service: Endoscopy;  Laterality: N/A;  end 0948  . MALONEY DILATION N/A 02/13/2013   Procedure: Venia Minks DILATION;  Surgeon: Daneil Dolin, MD;  Location: [facility];  Service: Endoscopy;  Laterality: N/A;  82mm      OB History    No data available       Home Medications    Prior to Admission medications   Medication Sig Start Date End Date Taking? Authorizing Provider  atorvastatin (LIPITOR) 20 MG tablet Take 20 mg by mouth daily.  01/29/13  Yes Historical Provider, MD  calcium-vitamin D (OSCAL WITH D) 500-200 MG-UNIT per tablet Take 1 tablet by mouth daily.   Yes Historical Provider, MD  losartan (COZAAR) 100 MG tablet Take 100 mg by mouth daily.  01/30/13  Yes Historical Provider, MD  omeprazole (PRILOSEC) 40 MG capsule TAKE 1 CAPSULE BY MOUTH EVERY DAY 08/12/14  Yes Mahala Menghini, PA-C  fish oil-omega-3 fatty acids 1000 MG capsule Take 2 g by mouth daily.    Historical Provider, MD  polyethylene glycol-electrolytes (TRILYTE) 420 G solution Take 4,000 mLs by mouth as directed. 02/06/13   Daneil Dolin, MD  PREMARIN 0.625 MG tablet Take 0.625 mg by mouth daily.  01/30/13   Historical Provider, MD  traMADol (ULTRAM) 50 MG tablet Take 1 tablet (50 mg total) by mouth every 6 (six) hours as needed. 01/06/17   Orpah Greek, MD  venlafaxine XR (EFFEXOR-XR) 37.5 MG 24 hr capsule Take 37.5 mg by mouth daily.    Historical Provider, MD    Family History Family History  Problem Relation Age of Onset  . Colon cancer Neg Hx  Social History Social History  Substance Use Topics  . Smoking status: Never Smoker  . Smokeless tobacco: Never Used  . Alcohol use Yes     Comment: red wine very little     Allergies   Percocet [oxycodone-acetaminophen]   Review of Systems Review of Systems  Musculoskeletal:       Left chest wall pain  Neurological: Positive for headaches.     Physical Exam Updated Vital Signs BP 197/91 (BP Location: Left Arm)   Pulse 80   Temp 97.7 F (36.5 C) (Oral)   Resp 17   Ht 5\' 1"  (1.549 m)   Wt 129 lb (58.5 kg)   SpO2 96%   BMI 24.37 kg/m   Physical Exam  Constitutional: She is oriented to person, place, and time. She appears well-developed and  well-nourished. No distress.  HENT:  Head: Normocephalic. Head is with contusion (Right parietal).  Right Ear: Hearing normal.  Left Ear: Hearing normal.  Nose: Nose normal.  Mouth/Throat: Oropharynx is clear and moist and mucous membranes are normal.  Eyes: Conjunctivae and EOM are normal. Pupils are equal, round, and reactive to light.  Neck: Normal range of motion. Neck supple.  Cardiovascular: Regular rhythm, S1 normal and S2 normal.  Exam reveals no gallop and no friction rub.   No murmur heard. Pulmonary/Chest: Effort normal and breath sounds normal. No respiratory distress. She exhibits tenderness (Left anterior lateral). She exhibits no crepitus.  Abdominal: Soft. Normal appearance and bowel sounds are normal. There is no hepatosplenomegaly. There is no tenderness. There is no rebound, no guarding, no tenderness at McBurney's point and negative Murphy's sign. No hernia.  Musculoskeletal: Normal range of motion.  Neurological: She is alert and oriented to person, place, and time. She has normal strength. No cranial nerve deficit or sensory deficit. Coordination normal. GCS eye subscore is 4. GCS verbal subscore is 5. GCS motor subscore is 6.  Skin: Skin is warm, dry and intact. No rash noted. No cyanosis.  Psychiatric: She has a normal mood and affect. Her speech is normal and behavior is normal. Thought content normal.  Nursing note and vitals reviewed.    ED Treatments / Results  Labs (all labs ordered are listed, but only abnormal results are displayed) Labs Reviewed - No data to display  EKG  EKG Interpretation None       Radiology Dg Ribs Unilateral W/chest Left  Result Date: 01/06/2017 CLINICAL DATA:  Chest wall pain after falling in the shower tonight. EXAM: LEFT RIBS AND CHEST - 3+ VIEW COMPARISON:  None. FINDINGS: No fracture or other bone lesions are seen involving the ribs. There is no evidence of pneumothorax or pleural effusion. Both lungs are clear except for  mild linear atelectatic opacities in the left base. Heart size and mediastinal contours are within normal limits. IMPRESSION: Mild linear atelectatic opacities in the left lung base. No displaced fractures. Electronically Signed   By: Andreas Newport M.D.   On: 01/06/2017 03:44   Ct Head Wo Contrast  Result Date: 01/06/2017 CLINICAL DATA:  [place] in the shower tonight, striking head on the floor. EXAM: CT HEAD WITHOUT CONTRAST TECHNIQUE: Contiguous axial images were obtained from the base of the skull through the vertex without intravenous contrast. COMPARISON:  08/09/2009 FINDINGS: Brain: There is no intracranial hemorrhage, mass or evidence of acute infarction. There is mild generalized atrophy. There is mild chronic microvascular ischemic change. There is no significant extra-axial fluid collection. No acute intracranial findings are evident. Vascular: No  hyperdense vessel or unexpected calcification. Skull: Normal. Negative for fracture or focal lesion. Sinuses/Orbits: No acute finding. Other: None. IMPRESSION: No acute intracranial findings. There is mild generalized atrophy and chronic small vessel ischemic disease. Electronically Signed   By: Andreas Newport M.D.   On: 01/06/2017 03:46    Procedures Procedures (including critical care time)  Medications Ordered in ED Medications - No data to display   Initial Impression / Assessment and Plan / ED Course  I have reviewed the triage vital signs and the nursing notes.  Pertinent labs & imaging results that were available during my care of the patient were reviewed by me and considered in my medical decision making (see chart for details).     She presents to the ER for evaluation after a fall. Patient complaining primarily of right-sided head pain after hitting her head as well as left rib pain. She did not have any midline cervical, thoracic or lumbar tenderness on examination. There was left sided rib tenderness without crepitance, no  left upper quadrant abdominal tenderness to suggest intra-abdominal organ injury. Patient was awake, alert and oriented. CT head performed, was negative for acute injury. X-ray of chest and ribs was also negative. Patient reassured, given return precautions.  Final Clinical Impressions(s) / ED Diagnoses   Final diagnoses:  Injury of head, initial encounter  Blunt trauma to chest, initial encounter    New Prescriptions New Prescriptions   TRAMADOL (ULTRAM) 50 MG TABLET    Take 1 tablet (50 mg total) by mouth every 6 (six) hours as needed.     Orpah Greek, MD 01/06/17 [phone]

## 2017-01-06 NOTE — ED Notes (Signed)
Patient transported to X-ray & CT °

## 2017-01-06 NOTE — ED Triage Notes (Signed)
Pt states she fell in shower, hit her head & left ribs.

## 2017-08-07 IMAGING — DX DG RIBS W/ CHEST 3+V*L*
4 series · 4 of 4 positions shown · non-contrast
Comparison: None.

CLINICAL DATA: Chest wall pain after falling in the shower tonight.

EXAM:
LEFT RIBS AND CHEST - 3+ VIEW

[chest pa]
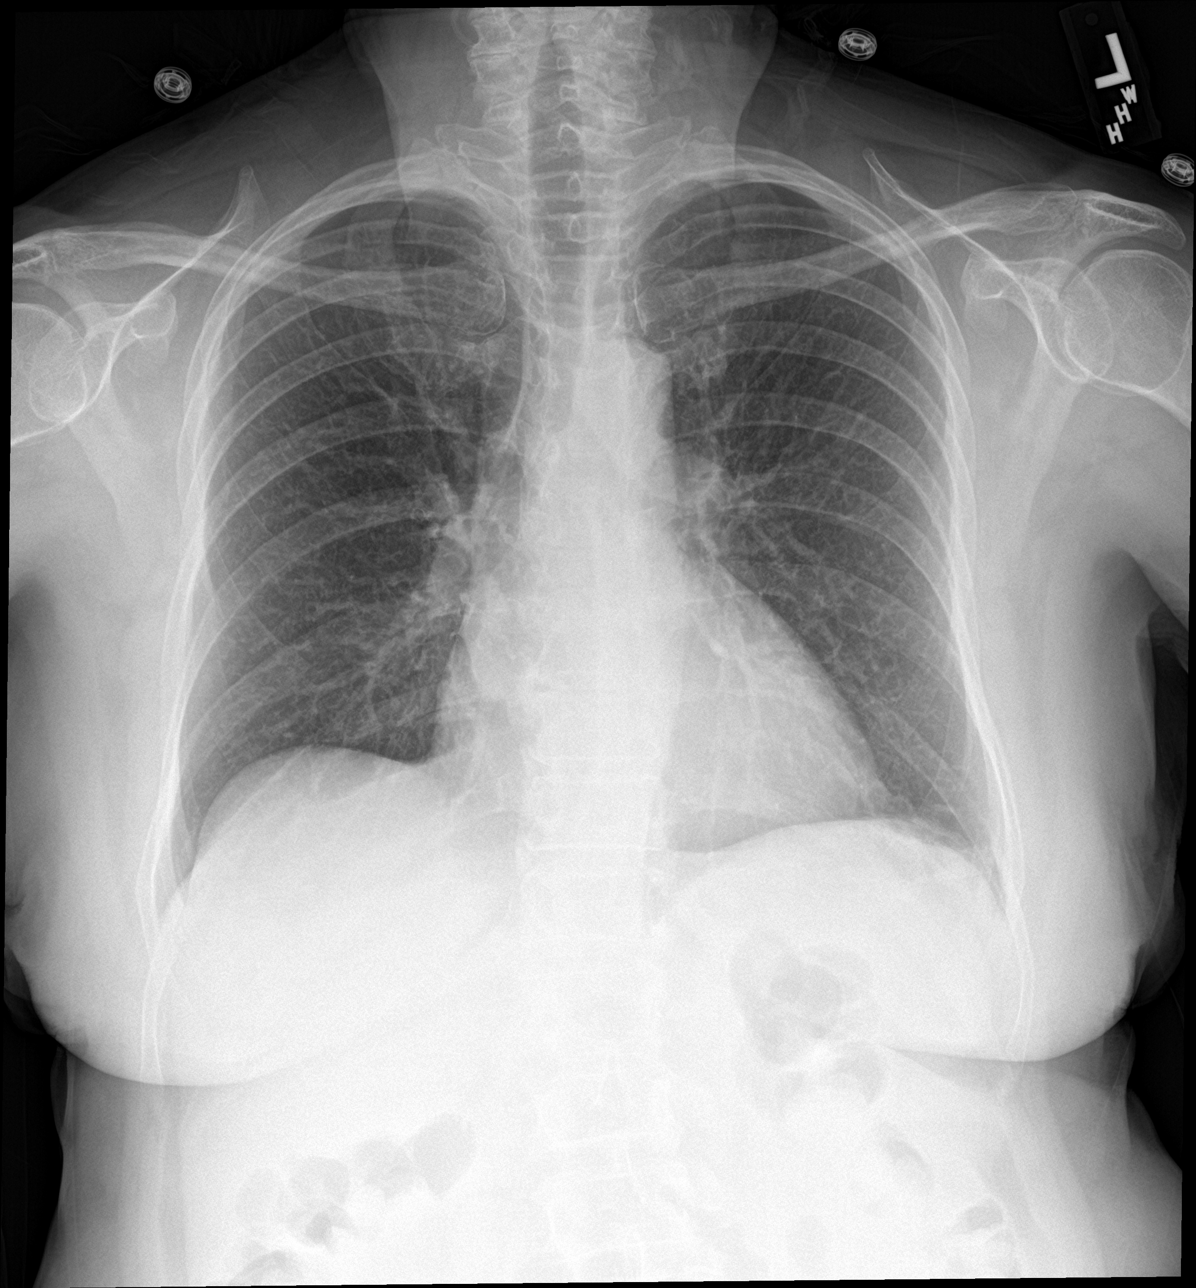

[rib pa obl (1 of 2)]
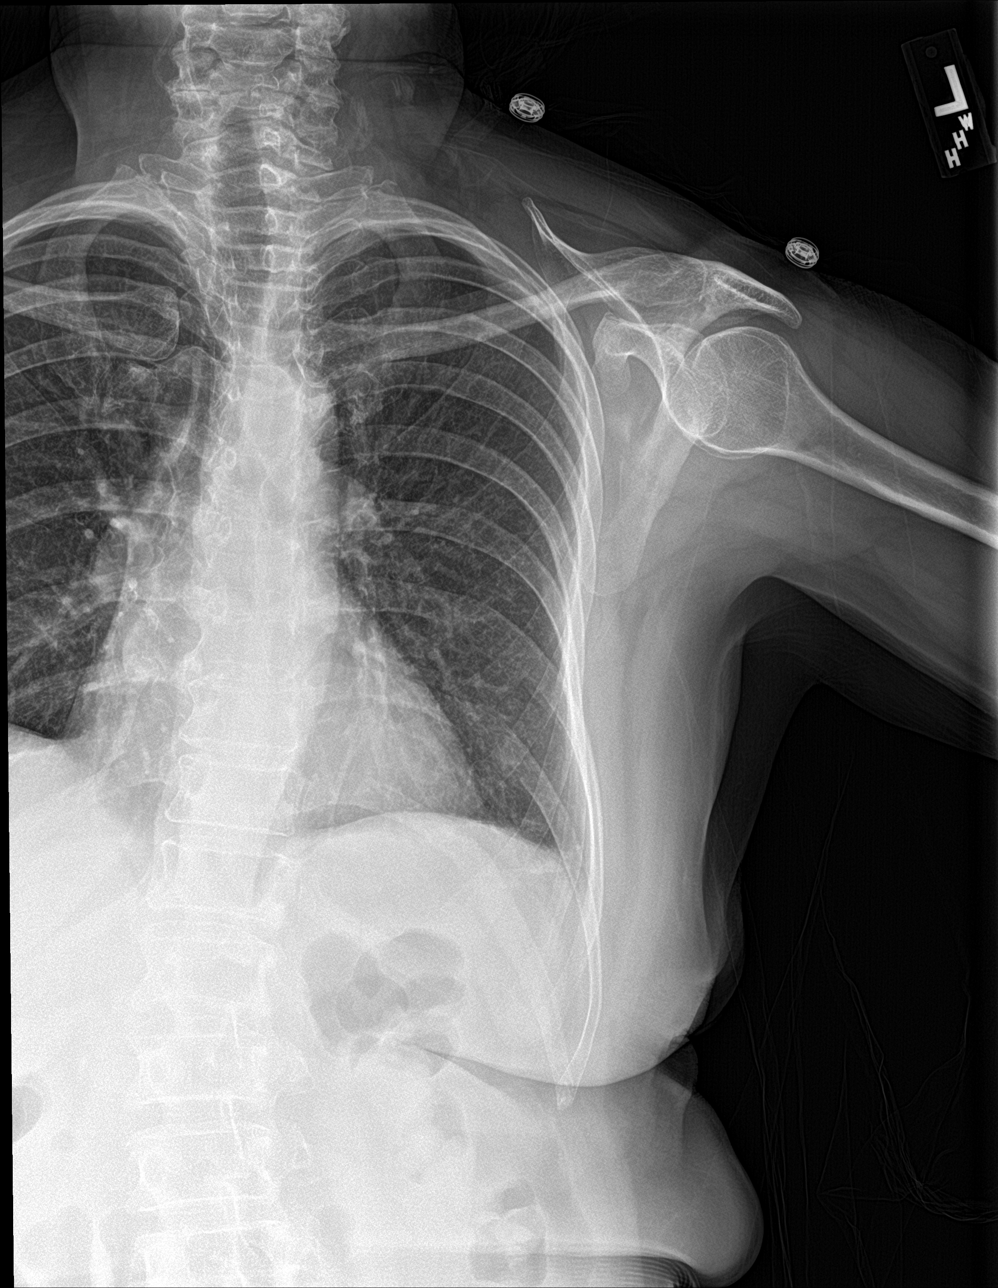

[rib pa obl (2 of 2)]
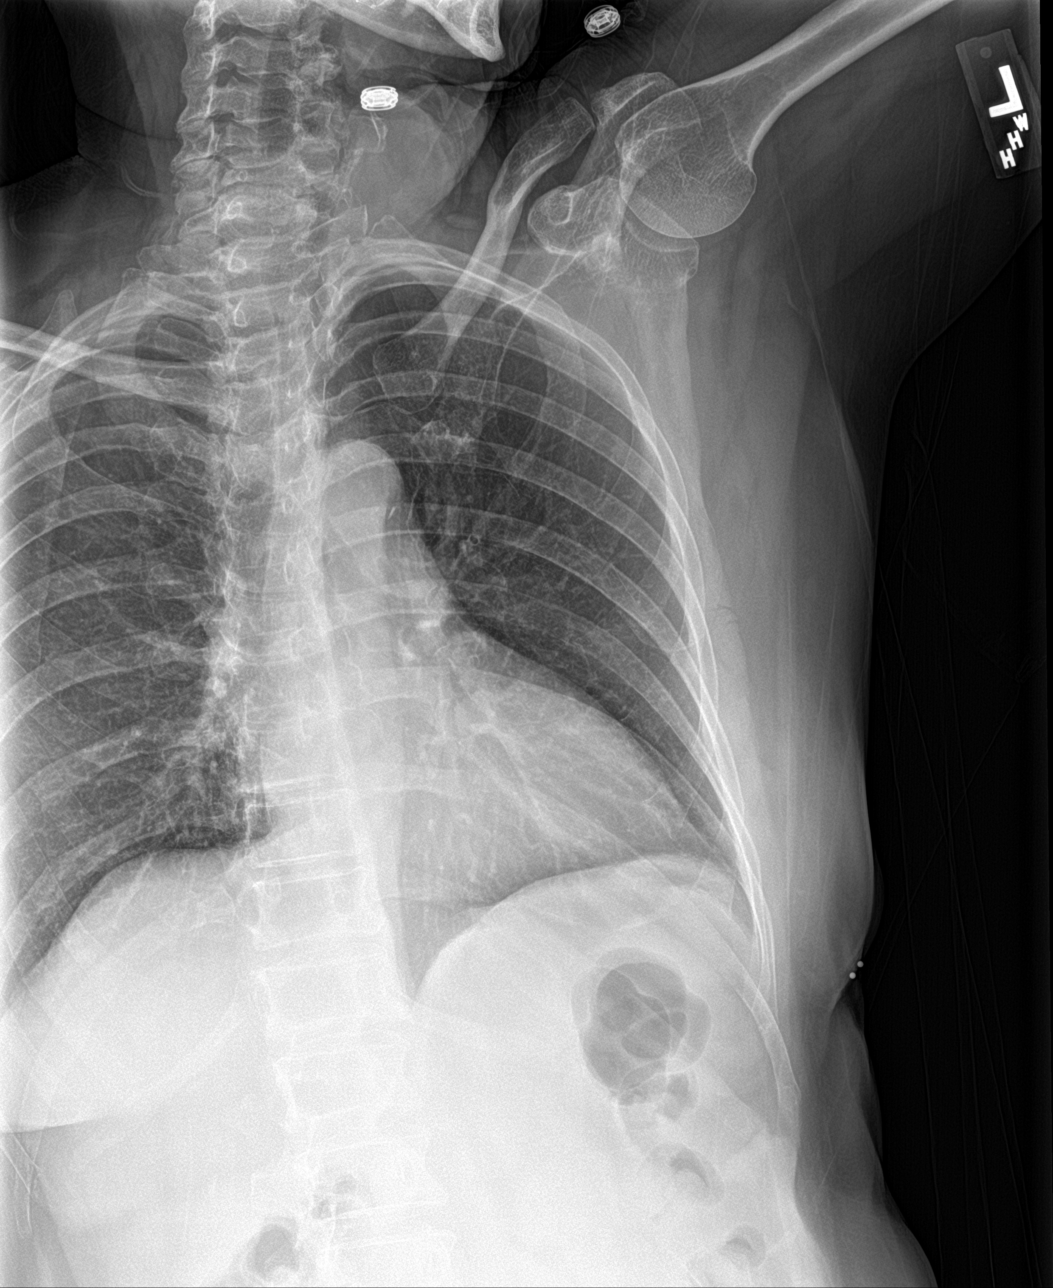

[rib pa]
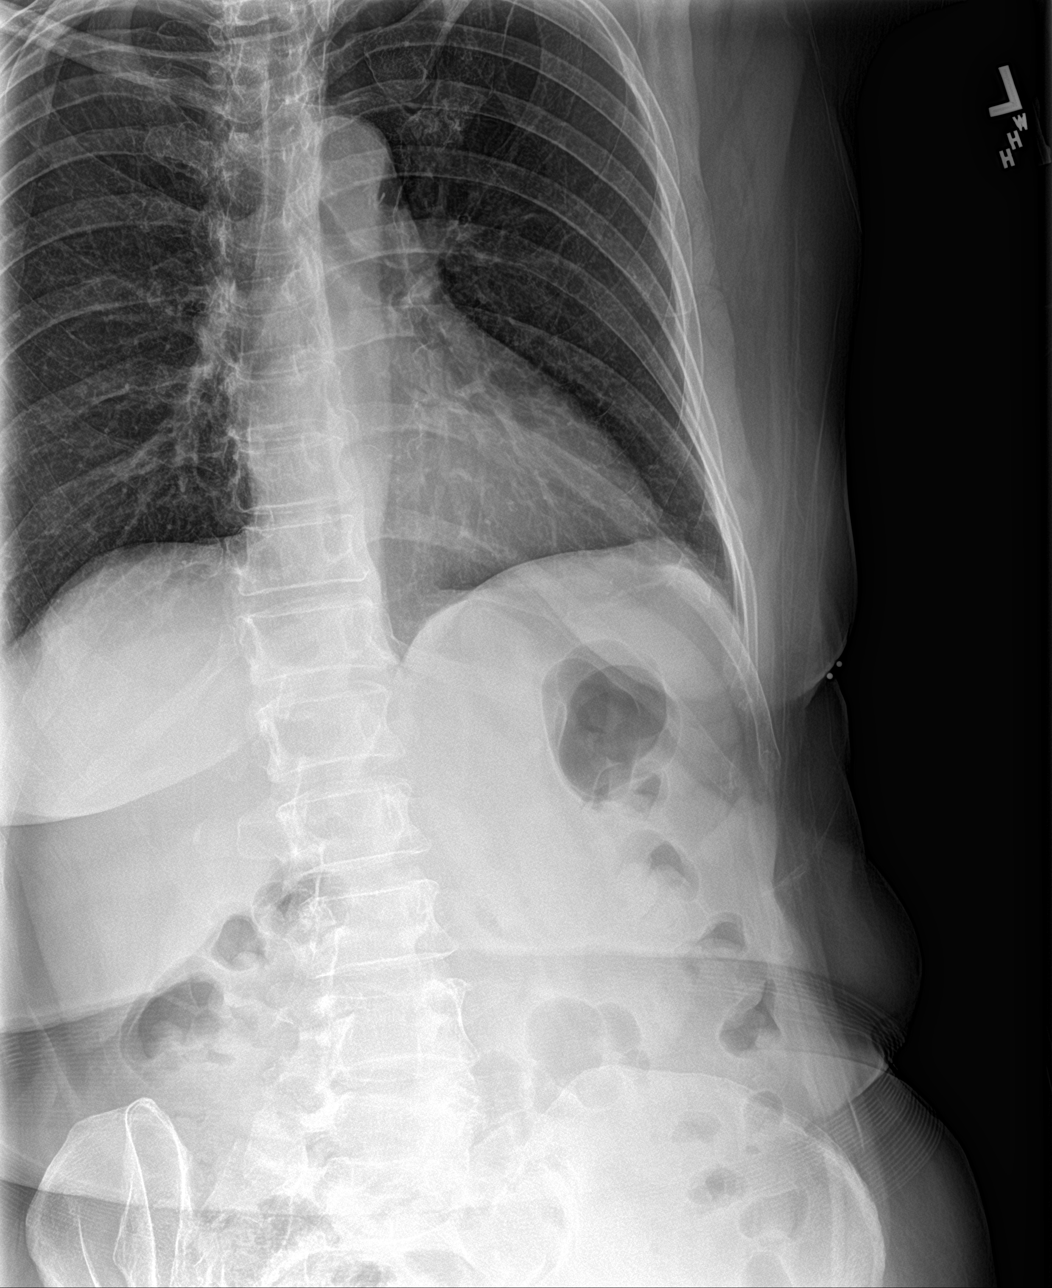

[4 of 4 positions shown; findings below may reference images not displayed]

FINDINGS: No fracture or other bone lesions are seen involving the ribs. There
is no evidence of pneumothorax or pleural effusion. Both lungs are
clear except for mild linear atelectatic opacities in the left base.
Heart size and mediastinal contours are within normal limits.
IMPRESSION: Mild linear atelectatic opacities in the left lung base. No
displaced fractures.

## 2022-01-12 ENCOUNTER — Ambulatory Visit: Payer: Medicare Other | Admitting: Orthopaedic Surgery

## 2022-08-08 ENCOUNTER — Emergency Department (HOSPITAL_COMMUNITY): Payer: Medicare Other

## 2022-08-08 ENCOUNTER — Encounter (HOSPITAL_COMMUNITY): Payer: Self-pay

## 2022-08-08 ENCOUNTER — Other Ambulatory Visit: Payer: Self-pay

## 2022-08-08 ENCOUNTER — Emergency Department (HOSPITAL_COMMUNITY)
Admission: EM | Admit: 2022-08-08 | Discharge: 2022-08-08 | Disposition: A | Discharge status: Home / Self Care | Payer: Medicare Other | Attending: Emergency Medicine | Admitting: Emergency Medicine

## 2022-08-08 DIAGNOSIS — M25552 Pain in left hip: Secondary | ICD-10-CM | POA: Insufficient documentation

## 2022-08-08 DIAGNOSIS — W1839XA Other fall on same level, initial encounter: Secondary | ICD-10-CM | POA: Diagnosis not present

## 2022-08-08 DIAGNOSIS — W19XXXA Unspecified fall, initial encounter: Secondary | ICD-10-CM

## 2022-08-08 MED ORDER — ACETAMINOPHEN 500 MG PO TABS
1000.0000 mg | ORAL_TABLET | Freq: Once | ORAL | Status: AC
  Administered 2022-08-08: 1000 mg via ORAL
  Filled 2022-08-08: qty 2

## 2022-08-08 NOTE — ED Triage Notes (Signed)
Pt to er, pt states that she is watching her daughters two big dogs, states that Saturday they knocked her over and she landed on her L side, states that her L side is very sore.  Pt ambulatory to triage

## 2022-08-08 NOTE — ED Notes (Signed)
Patient transported to X-ray 

## 2022-08-08 NOTE — ED Provider Notes (Signed)
[facility] EMERGENCY DEPARTMENT Provider Note   CSN: [id number] Arrival date & time: 08/08/22  1615     History Chief Complaint  Patient presents with   Fall    HPI Sandra Leblanc is a 76 y.o. female presenting for left-sided hip pain after being tackled by dogs 2 days ago.  She denies fevers or chills nausea vomiting syncope shortness of breath.  She states that it was a mechanical fall after dog tried to jump over a fence near her and ran directly into her she has had residual left hip pain.  She is ambulatory and tolerating p.o. intake.  The incident happened 2 days ago.  She has been taken Motrin with some symptomatic improvement..   Patient's recorded medical, surgical, social, medication list and allergies were reviewed in the Snapshot window as part of the initial history.   Review of Systems   Review of Systems  Constitutional:  Negative for chills and fever.  HENT:  Negative for ear pain and sore throat.   Eyes:  Negative for pain and visual disturbance.  Respiratory:  Negative for cough and shortness of breath.   Cardiovascular:  Negative for chest pain and palpitations.  Gastrointestinal:  Negative for abdominal pain and vomiting.  Genitourinary:  Negative for dysuria and hematuria.  Musculoskeletal:  Negative for arthralgias and back pain.  Skin:  Negative for color change and rash.  Neurological:  Negative for seizures and syncope.  All other systems reviewed and are negative.   Physical Exam Updated Vital Signs BP 135/84 (BP Location: Right Arm)   Pulse 75   Temp 98 F (36.7 C) (Oral)   Resp 17   Ht '5\' 1"'$  (1.549 m)   Wt 60.8 kg   SpO2 100%   BMI 25.32 kg/m  Physical Exam Vitals and nursing note reviewed.  Constitutional:      General: She is not in acute distress.    Appearance: She is well-developed.  HENT:     Head: Normocephalic and atraumatic.  Eyes:     Conjunctiva/sclera: Conjunctivae normal.  Cardiovascular:     Rate and Rhythm: Normal rate  and regular rhythm.     Heart sounds: No murmur heard. Pulmonary:     Effort: Pulmonary effort is normal. No respiratory distress.     Breath sounds: Normal breath sounds.  Abdominal:     General: There is no distension.     Palpations: Abdomen is soft.     Tenderness: There is no abdominal tenderness. There is no right CVA tenderness or left CVA tenderness.  Musculoskeletal:        General: Tenderness (TTP in the right hip) present. No swelling. Normal range of motion.     Cervical back: Neck supple.  Skin:    General: Skin is warm and dry.  Neurological:     General: No focal deficit present.     Mental Status: She is alert and oriented to person, place, and time. Mental status is at baseline.     Cranial Nerves: No cranial nerve deficit.      ED Course/ Medical Decision Making/ A&P Clinical Course as of 08/08/22 1915  Tue Aug 08, 2022  1824 Xrs and DC [CC]    Clinical Course User Index [CC] Tretha Sciara, MD    Procedures Procedures   Medications Ordered in ED Medications  acetaminophen (TYLENOL) tablet 1,000 mg (1,000 mg Oral Given 08/08/22 1817)   Medical Decision Making:    Sandra Leblanc is a 76  y.o. female who presented to the ED today with a moderate mechanisma trauma, detailed above.    Patient's presentation is complicated by their history of advanced age.  Patient placed on continuous vitals and telemetry monitoring while in ED which was reviewed periodically.   Given this mechanism of trauma, a full physical exam was performed. Notably, patient was hemodynamically stable in no acute distress.  Patient is ambulatory.   Reviewed and confirmed nursing documentation for past medical history, family history, social history.    Initial Assessment/Plan:   This is a patient presenting with a moderate mechanism trauma.  As such, I have considered intracranial injuries including intracranial hemorrhage, intrathoracic injuries including blunt myocardial or blunt  lung injury, blunt abdominal injuries including aortic dissection, bladder injury, spleen injury, liver injury and I have considered orthopedic injuries including extremity or spinal injury.  With the patient's presentation of moderate mechanism trauma but an otherwise reassuring exam, patient warrants targeted evaluation for potential traumatic injuries. Will proceed with targeted evaluation for potential injuries. Will proceed with XR left hip. Objective evaluation resulted with no acute pathology.   Final Reassessment and Plan:   Reassessed patient after 3 hours in emergency department.  She remains ambulatory in no acute distress.  Uncertain underlying etiology of patient's symptoms though unlikely to be severe traumatic injury at this time.  Informed patient on finding and recommended close outpatient follow-up with primary care provider in the outpatient setting for reassessment and possible orthopedics referral if her symptoms or not improving.  This may have been a dislocation relocation event which may explain her ongoing pain.  Recommended close primary care follow-up and patient expressed understanding.  Patient discharged with no acute events.    Clinical Impression:  1. Fall, initial encounter      Discharge   Final Clinical Impression(s) / ED Diagnoses Final diagnoses:  Fall, initial encounter    Rx / DC Orders ED Discharge Orders     None         Tretha Sciara, MD 08/08/22 [phone]

## 2023-01-18 ENCOUNTER — Encounter: Payer: Self-pay | Admitting: *Deleted

## 2023-09-24 ENCOUNTER — Ambulatory Visit: Payer: Medicare Other | Attending: Cardiology | Admitting: Cardiology

## 2023-09-24 ENCOUNTER — Encounter: Payer: Self-pay | Admitting: Cardiology

## 2023-09-24 VITALS — BP 140/68 | HR 57 | Resp 16 | Ht 61.0 in | Wt 137.6 lb

## 2023-09-24 DIAGNOSIS — I10 Essential (primary) hypertension: Secondary | ICD-10-CM | POA: Diagnosis not present

## 2023-09-24 DIAGNOSIS — E782 Mixed hyperlipidemia: Secondary | ICD-10-CM | POA: Diagnosis present

## 2023-09-24 DIAGNOSIS — G4730 Sleep apnea, unspecified: Secondary | ICD-10-CM | POA: Insufficient documentation

## 2023-09-24 NOTE — Progress Notes (Signed)
Cardiology Office Note:    Date:  09/24/2023  NAME:  Sandra Leblanc    MRN: [id number] DOB:  01-27-1946   PCP:  Craig Staggers, MD  Former Cardiology Providers: Dr. Leonie Green Primary Cardiologist:  Tessa Lerner, DO, [facility] (established care 09/24/2023) Electrophysiologist:  None   Referring MD: Craig Staggers, MD  Reason of Consult: Benign essential hypertension  Chief Complaint  Patient presents with   Hypertension   Establish Care    History of Present Illness:    Sandra Leblanc is a 77 y.o. Caucasian female whose past medical history and cardiovascular risk factors includes: Hypertension, hyperlipidemia, hormone replacement therapy, sleep apnea s/p Inspire implant at [facility]. She is being seen today for the evaluation of hypertension at the request of Vasireddy, Sharyn Lull, MD.  Patient was referred to practice for evaluation and management of benign essential hypertension and valvular heart disease.  Patient used to follow with a cardiologist at [facility] who did an echocardiogram prior to her inspire implant and was noted to have mild valvular heart disease and was asked to follow-up.  However he recently had left and patient is referred to the practice to reestablish care.  Home blood pressures range between 135-140 mmHg on current medical therapy.  Patient states that she could do better with reducing salt in her diet.  Current Medications: Current Meds  Medication Sig   atorvastatin (LIPITOR) 20 MG tablet Take 20 mg by mouth daily.    calcium-vitamin D (OSCAL WITH D) 500-200 MG-UNIT per tablet Take 1 tablet by mouth daily.   fish oil-omega-3 fatty acids 1000 MG capsule Take 2 g by mouth daily.   irbesartan (AVAPRO) 300 MG tablet Take 300 mg by mouth daily.   omeprazole (PRILOSEC) 40 MG capsule TAKE 1 CAPSULE BY MOUTH EVERY DAY   PREMARIN 0.625 MG tablet Take 0.625 mg by mouth daily.      Allergies:    Percocet [oxycodone-acetaminophen]   Past Medical History: Past Medical  History:  Diagnosis Date   Allergic rhinitis    Arthritis    Central sleep apnea    Chronic kidney disease (CKD), stage III (moderate) (HCC)    Congenital abnormality of nipple    Coronary atherosclerosis    Decreased estrogen level    DM (diabetes mellitus), type 2 with complications (HCC)    GERD (gastroesophageal reflux disease)    Hypercholesterolemia    Hypertension    Hypertriglyceridemia    Migraine    Mixed hyperlipidemia    OSA (obstructive sleep apnea)    Osteoporosis    Psychophysiologic insomnia    Sleep apnea    Temporomandibular joint pain-dysfunction syndrome    Vitamin D deficiency     Past Surgical History: Past Surgical History:  Procedure Laterality Date   BLADDER SURGERY     BREAST REDUCTION SURGERY     BREAST SURGERY     COLONOSCOPY WITH PROPOFOL N/A 02/13/2013   Procedure: COLONOSCOPY WITH PROPOFOL;  Surgeon: Corbin Ade, MD;  Location: [facility];  Service: Endoscopy;  Laterality: N/A;  start 0954; in cecum at 1003 ; out of cecum at 1020  ; total time = 17 minutes   ECTOPIC PREGNANCY SURGERY     ESOPHAGOGASTRODUODENOSCOPY (EGD) WITH PROPOFOL N/A 02/13/2013   Procedure: ESOPHAGOGASTRODUODENOSCOPY (EGD) WITH PROPOFOL;  Surgeon: Corbin Ade, MD;  Location: [facility];  Service: Endoscopy;  Laterality: N/A;  end 4098   MALONEY DILATION N/A 02/13/2013   Procedure: Elease Hashimoto DILATION;  Surgeon: Corbin Ade, MD;  Location: [facility];  Service: Endoscopy;  Laterality: N/A;  54mm    Social History: Social History   Tobacco Use   Smoking status: Never   Smokeless tobacco: Never  Substance Use Topics   Alcohol use: Not Currently    Comment: red wine very little   Drug use: No    Family History: Family History  Problem Relation Age of Onset   Hypertension Mother    Arthritis Mother    Liver cancer Father    Colon cancer Neg Hx     ROS:   Review of Systems  Cardiovascular:  Negative for chest pain, claudication, dyspnea on exertion, irregular  heartbeat, leg swelling, near-syncope, orthopnea, palpitations, paroxysmal nocturnal dyspnea and syncope.  Respiratory:  Negative for shortness of breath.   Hematologic/Lymphatic: Negative for bleeding problem.  Musculoskeletal:  Negative for muscle cramps and myalgias.  Neurological:  Negative for dizziness and light-headedness.    EKGs/Labs/Other Studies Reviewed:   EKG Interpretation Date/Time:  Monday September 24 2023 14:02:06 EDT Ventricular Rate:  57 PR Interval:  176 QRS Duration:  66 QT Interval:  434 QTC Calculation: 422 R Axis:   29  Text Interpretation: Sinus bradycardia When compared with ECG of 10-Feb-2013 13:10, No significant change was found Confirmed by Tessa Lerner [phone]) on 09/24/2023 2:12:08 PM      Labs:    Latest Ref Rng & Units 02/10/2013    1:05 PM 08/09/2009    3:28 PM  CBC  WBC 4.0 - 10.5 K/uL  8.8   Hemoglobin 12.0 - 15.0 g/dL 19.1  47.8   Hematocrit 36.0 - 46.0 % 39.4  40.5   Platelets 150 - 400 K/uL  200        Latest Ref Rng & Units 02/10/2013    1:05 PM 08/09/2009    3:28 PM  BMP  Glucose 70 - 99 mg/dL 73  89   BUN 6 - 23 mg/dL 14  11   Creatinine 2.95 - 1.10 mg/dL 6.21  3.08   Sodium 657 - 145 mEq/L 139  140   Potassium 3.5 - 5.1 mEq/L 4.3  4.2   Chloride 96 - 112 mEq/L 101  104   CO2 19 - 32 mEq/L 28  28   Calcium 8.4 - 10.5 mg/dL 9.7  9.7       Latest Ref Rng & Units 02/10/2013    1:05 PM 08/09/2009    3:28 PM  CMP  Glucose 70 - 99 mg/dL 73  89   BUN 6 - 23 mg/dL 14  11   Creatinine 8.46 - 1.10 mg/dL 9.62  9.52   Sodium 841 - 145 mEq/L 139  140   Potassium 3.5 - 5.1 mEq/L 4.3  4.2   Chloride 96 - 112 mEq/L 101  104   CO2 19 - 32 mEq/L 28  28   Calcium 8.4 - 10.5 mg/dL 9.7  9.7     No results found for: "CHOL", "HDL", "LDLCALC", "LDLDIRECT", "TRIG", "CHOLHDL" No results for input(s): "LIPOA" in the last 8760 hours. No components found for: "NTPROBNP" No results for input(s): "PROBNP" in the last 8760 hours. No results for  input(s): "TSH" in the last 8760 hours.  Physical Exam:    Today's Vitals   09/24/23 1358  BP: (!) 140/68  Pulse: (!) 57  Resp: 16  SpO2: 96%  Weight: 137 lb 9.6 oz (62.4 kg)  Height: 5\' 1"  (1.549 m)   Body mass index is 26 kg/m. Wt Readings from Last  3 Encounters:  09/24/23 137 lb 9.6 oz (62.4 kg)  08/08/22 134 lb (60.8 kg)  01/06/17 129 lb (58.5 kg)    Physical Exam  Constitutional: No distress.  hemodynamically stable  Neck: No JVD present.  Cardiovascular: Normal rate, regular rhythm, S1 normal, S2 normal, intact distal pulses and normal pulses. Exam reveals no gallop, no S3 and no S4.  No murmur heard. Pulses:      Dorsalis pedis pulses are 2+ on the right side and 2+ on the left side.       Posterior tibial pulses are 2+ on the right side and 2+ on the left side.  Pulmonary/Chest: Effort normal and breath sounds normal. No stridor. She has no wheezes. She has no rales.  Abdominal: Soft. Bowel sounds are normal. She exhibits no distension. There is no abdominal tenderness.  Musculoskeletal:        General: No edema.     Cervical back: Neck supple.  Neurological: She is alert and oriented to person, place, and time. She has intact cranial nerves (2-12).  Skin: Skin is warm and moist.   Impression & Recommendation(s):  Impression:   ICD-10-CM   1. Benign hypertension  I10 EKG 12-Lead    ECHOCARDIOGRAM COMPLETE    2. Mixed hyperlipidemia  E78.2     3. Sleep apnea in adult  G47.30        Recommendation(s):  Benign hypertension Office blood pressures are acceptable, but not at goal. We discussed up titration of medical therapy; however, patient stated very significant room with regards to reducing salt in her diet which may greatly impact her blood pressure readings that she is getting. Shared decision was to hold off on further medication titration at this time. She will focus on reducing salt in her diet as much as possible with continuing monitoring of  ambulatory blood pressure readings. Recommend a goal SBP of 130 mmHg. If she needs further assistance will call us back for further medication titration.  Mixed hyperlipidemia Currently on atorvastatin 20 mg p.o. nightly. No recent labs available for review. Currently managed by primary care provider.  Patient states that she was noted to have a " leaky valve" prior prior echocardiogram at [facility] and she was recommended to have follow-up studies.  Patient stated she does not recall when her last echocardiogram was done.  Will proceed forward with an echocardiogram to reevaluate for valvular heart disease.  She is also had vein work prior to establishing care with myself.  I have advised her to follow-up/establish care with vascular and vein going forward.  Patient states that she will discuss with PCP  Orders Placed:  Orders Placed This Encounter  Procedures   EKG 12-Lead   ECHOCARDIOGRAM COMPLETE    Standing Status:   Future    Standing Expiration Date:   09/23/2024    Order Specific Question:   Where should this test be performed    Answer:   [facility])    Order Specific Question:   Does the patient weigh less than or greater than 250 lbs?    Answer:   Patient weighs less than 250 lbs    Order Specific Question:   Perflutren DEFINITY (image enhancing agent) should be administered unless hypersensitivity or allergy exist    Answer:   Administer Perflutren    Order Specific Question:   Reason for exam-Echo    Answer:   Other-Full Diagnosis List    Order Specific Question:   Full ICD-10/Reason  for Exam    Answer:   Hypertension [242335]    As part of medical decision making results of the referring providers documentation, EKG were reviewed independently at today's visit.   Final Medication List:   No orders of the defined types were placed in this encounter.   Medications Discontinued During This Encounter  Medication Reason   polyethylene  glycol-electrolytes (TRILYTE) 420 G solution    losartan (COZAAR) 100 MG tablet Change in therapy   traMADol (ULTRAM) 50 MG tablet Patient Preference   venlafaxine XR (EFFEXOR-XR) 37.5 MG 24 hr capsule Patient Preference     Current Outpatient Medications:    atorvastatin (LIPITOR) 20 MG tablet, Take 20 mg by mouth daily. , Disp: , Rfl:    calcium-vitamin D (OSCAL WITH D) 500-200 MG-UNIT per tablet, Take 1 tablet by mouth daily., Disp: , Rfl:    fish oil-omega-3 fatty acids 1000 MG capsule, Take 2 g by mouth daily., Disp: , Rfl:    irbesartan (AVAPRO) 300 MG tablet, Take 300 mg by mouth daily., Disp: , Rfl:    omeprazole (PRILOSEC) 40 MG capsule, TAKE 1 CAPSULE BY MOUTH EVERY DAY, Disp: 30 capsule, Rfl: 11   PREMARIN 0.625 MG tablet, Take 0.625 mg by mouth daily. , Disp: , Rfl:   Consent:   N/A  Disposition:   1 year or sooner if needed.  Her questions and concerns were addressed to her satisfaction. She voices understanding of the recommendations provided during this encounter.    Signed, Tessa Lerner, DO, [facility]  [facility]  [address] [place], [place] [address] 09/24/2023 5:30 PM

## 2023-09-24 NOTE — Patient Instructions (Signed)
Medication Instructions:  Your physician recommends that you continue on your current medications as directed. Please refer to the Current Medication list given to you today.  *If you need a refill on your cardiac medications before your next appointment, please call your pharmacy*  Lab Work: None ordered today. If you have labs (blood work) drawn today and your tests are completely normal, you will receive your results only by: MyChart Message (if you have MyChart) OR A paper copy in the mail If you have any lab test that is abnormal or we need to change your treatment, we will call you to review the results.  Testing/Procedures: Your physician has requested that you have an echocardiogram. Echocardiography is a painless test that uses sound waves to create images of your heart. It provides your doctor with information about the size and shape of your heart and how well your heart's chambers and valves are working. This procedure takes approximately one hour. There are no restrictions for this procedure. Please do NOT wear cologne, perfume, aftershave, or lotions (deodorant is allowed). Please arrive 15 minutes prior to your appointment time.   Follow-Up: At [facility], you and your health needs are our priority.  As part of our continuing mission to provide you with exceptional heart care, we have created designated Provider Care Teams.  These Care Teams include your primary Cardiologist (physician) and Advanced Practice Providers (APPs -  Physician Assistants and Nurse Practitioners) who all work together to provide you with the care you need, when you need it.  *Have your doctor refer you to vascular vein specialist for a follow-up*  Your next appointment:   1 year(s)  The format for your next appointment:   In Person  Provider:   Tessa Lerner, DO {

## 2023-10-19 ENCOUNTER — Ambulatory Visit (HOSPITAL_COMMUNITY): Payer: Medicare Other | Attending: Cardiology

## 2023-10-19 DIAGNOSIS — I10 Essential (primary) hypertension: Secondary | ICD-10-CM | POA: Diagnosis not present

## 2023-10-19 LAB — ECHOCARDIOGRAM COMPLETE
Area-P 1/2: 2.99 cm2
S' Lateral: 2.6 cm

## 2023-10-24 ENCOUNTER — Telehealth: Payer: Self-pay | Admitting: Cardiology

## 2023-10-24 NOTE — Telephone Encounter (Signed)
Spoke with Pt and results given. However I do not see a next office visit scheduled and results stated results will be give in detail at next visit. AVS from last visit lists for a year. Told pt we would reach back out once we knew if a sooner appointment should be scheduled.

## 2023-10-24 NOTE — Telephone Encounter (Signed)
Patient called to follow-up on Echocardiogram test results. 

## 2023-10-29 ENCOUNTER — Telehealth: Payer: Self-pay

## 2023-10-29 NOTE — Telephone Encounter (Signed)
LVM explaining next visit will be one year from previous office visit unless otherwise needed per pt.

## 2023-10-29 NOTE — Telephone Encounter (Signed)
To clarify - follow up in one year from last OV sooner if needed.   Amad Mau Rockwell, DO, [facility]

## 2023-10-31 ENCOUNTER — Telehealth: Payer: Self-pay | Admitting: Cardiology

## 2023-10-31 NOTE — Telephone Encounter (Signed)
LVM for pt to call back regarding echo results.

## 2023-10-31 NOTE — Telephone Encounter (Signed)
Patient called to follow-up on echocardiogram results.

## 2023-11-20 ENCOUNTER — Other Ambulatory Visit (HOSPITAL_COMMUNITY): Payer: Medicare Other

## 2023-12-20 ENCOUNTER — Other Ambulatory Visit: Payer: Self-pay | Admitting: *Deleted

## 2023-12-20 DIAGNOSIS — I8393 Asymptomatic varicose veins of bilateral lower extremities: Secondary | ICD-10-CM

## 2023-12-24 ENCOUNTER — Ambulatory Visit (HOSPITAL_COMMUNITY): Payer: Medicare Other | Attending: Surgery

## 2024-05-14 ENCOUNTER — Telehealth (HOSPITAL_COMMUNITY): Payer: Self-pay | Admitting: Radiology

## 2024-05-14 NOTE — Telephone Encounter (Signed)
 Patient called wants to schedule appointment with Dr. Albert Huff.

## 2024-05-17 NOTE — Telephone Encounter (Signed)
 Lonni Robert, Please follow up.   Teancum Brule Shumway, DO, [facility]

## 2024-09-29 ENCOUNTER — Telehealth (INDEPENDENT_AMBULATORY_CARE_PROVIDER_SITE_OTHER): Payer: Self-pay

## 2024-09-29 ENCOUNTER — Ambulatory Visit (INDEPENDENT_AMBULATORY_CARE_PROVIDER_SITE_OTHER)

## 2024-09-29 VITALS — BP 167/76 | HR 58 | Temp 97.9°F | Wt 143.0 lb

## 2024-09-29 DIAGNOSIS — J358 Other chronic diseases of tonsils and adenoids: Secondary | ICD-10-CM | POA: Diagnosis not present

## 2024-09-29 NOTE — Progress Notes (Signed)
 HPI:  Discussed the use of AI scribe software for clinical note transcription with the patient, who gave verbal consent to proceed.  History of Present Illness Sandra Leblanc is an 78 year old female who presents with a cyst on her tonsil. The cyst was initially seen by an ENT in [place] who she was seeing for hearing issues. She reports no pain but notes a weakening of her voice and frequent throat clearing. She is concerned about the cyst, particularly given her age.  Approximately a month ago, she was diagnosed with strep throat. She was treated with antibiotics for the strep throat, although she did not experience typical symptoms such as a sore throat. She recalls a history of a severe sore throat about ten years ago, which she did not seek medical attention for due to lack of health insurance at the time.  She has a history of sleep apnea and had a hypoglossal nerve stimulator implant placed at [facility] three years ago to address the issue. As far as she knows, the implant has been working well.  Her blood pressure is currently high, which she attributes to recent consumption of salty foods. She denies any history of radiation exposure to the head or neck. She is extremely anxious about this tonsillar cyst as she did have a friend that passed away due to throat cancer. She wants to address this cyst so that she is not constantly thinking about it.   Her current medications include cholesterol medication, omeprazole  as needed, a blood pressure pill, vitamin D3, and occasionally fish oil and vitamin B with C. She denies taking any blood thinners or aspirin. She also mentions taking fiber supplements occasionally.    PMH/Meds/All/SocHx/FamHx/ROS: Past Medical History:  Diagnosis Date   Allergic rhinitis    Arthritis    Central sleep apnea    Chronic kidney disease (CKD), stage III (moderate) (HCC)    Congenital abnormality of nipple    Coronary atherosclerosis    Decreased estrogen  level    DM (diabetes mellitus), type 2 with complications (HCC)    GERD (gastroesophageal reflux disease)    Hypercholesterolemia    Hypertension    Hypertriglyceridemia    Migraine    Mixed hyperlipidemia    OSA (obstructive sleep apnea)    Osteoporosis    Psychophysiologic insomnia    Sleep apnea    Temporomandibular joint pain-dysfunction syndrome    Vitamin D deficiency    Past Surgical History:  Procedure Laterality Date   BLADDER SURGERY     BREAST REDUCTION SURGERY     BREAST SURGERY     COLONOSCOPY WITH PROPOFOL  N/A 02/13/2013   Procedure: COLONOSCOPY WITH PROPOFOL ;  Surgeon: Lamar CHRISTELLA Hollingshead, MD;  Location: [facility];  Service: Endoscopy;  Laterality: N/A;  start 0954; in cecum at 1003 ; out of cecum at 1020  ; total time = 17 minutes   ECTOPIC PREGNANCY SURGERY     ESOPHAGOGASTRODUODENOSCOPY (EGD) WITH PROPOFOL  N/A 02/13/2013   Procedure: ESOPHAGOGASTRODUODENOSCOPY (EGD) WITH PROPOFOL ;  Surgeon: Lamar CHRISTELLA Hollingshead, MD;  Location: [facility];  Service: Endoscopy;  Laterality: N/A;  end 9051   MALONEY DILATION N/A 02/13/2013   Procedure: AGAPITO DILATION;  Surgeon: Lamar CHRISTELLA Hollingshead, MD;  Location: [facility];  Service: Endoscopy;  Laterality: N/A;  54mm   No family history of bleeding disorders, wound healing problems or difficulty with anesthesia.  Social Connections: Not on file    Current Outpatient Medications:    atorvastatin (LIPITOR) 20 MG tablet,  Take 20 mg by mouth daily. , Disp: , Rfl:    calcium-vitamin D (OSCAL WITH D) 500-200 MG-UNIT per tablet, Take 1 tablet by mouth daily., Disp: , Rfl:    estradiol (ESTRACE) 0.5 MG tablet, Take 0.5 mg by mouth daily., Disp: , Rfl:    fish oil-omega-3 fatty acids 1000 MG capsule, Take 2 g by mouth daily., Disp: , Rfl:    irbesartan (AVAPRO) 300 MG tablet, Take 300 mg by mouth daily., Disp: , Rfl:    omeprazole  (PRILOSEC) 40 MG capsule, TAKE 1 CAPSULE BY MOUTH EVERY DAY, Disp: 30 capsule, Rfl: 11   PREMARIN 0.625 MG tablet, Take 0.625 mg by  mouth daily.  (Patient not taking: Reported on 09/29/2024), Disp: , Rfl:  A complete ROS was performed with pertinent positives/negatives noted in the HPI. The remainder of the ROS are negative.   Physical Exam:  BP (!) 167/76 (BP Location: Left Arm, Patient Position: Sitting, Cuff Size: Normal)   Pulse (!) 58   Temp 97.9 F (36.6 C) (Oral)   Wt 143 lb (64.9 kg)   BMI 27.02 kg/m  General: Well developed, well nourished. No acute distress. Voice normal Head/Face: Normocephalic. No sinus tenderness. Facial nerve intact and equal bilaterally. No facial lacerations. Eyes: PERRL, no scleral icterus or conjunctival hemorrhage. EOMI. Ears: No gross deformity. Normal external canal. Tympanic membrane in tact bilaterally Hearing: Normal speech reception.  Nose: No gross deformity or lesions. No purulent discharge. No turbinate hypertrophy. Mouth/Oropharynx: Lips without any lesions. Dentition good. No mucosal lesions within the oropharynx. No tonsillar enlargement, exudate. Left tonsil with a cyst along the posterior superior aspect. Pharyngeal walls symmetrical. Uvula midline. Tongue midline without lesions. Larynx: See TFL if applicable Nasopharynx: See TFL if applicable Neck: Trachea midline. No masses. No thyromegaly or nodules palpated. No crepitus. Lymphatic: No lymphadenopathy in the neck. Respiratory: No stridor or distress. Room air. Cardiovascular: Regular rate and rhythm. Extremities: No edema or cyanosis. Warm and well-perfused. Skin: No scars or lesions on face or neck. Neurologic: CN II-XII grossly intact. Moving all extremities without gross abnormality. Other:  Independent Review of Additional Tests or Records: None Procedures: None Impression & Plans:  Assessment & Plan Left tonsillar cyst Mucus-filled cyst on posterior left tonsil, benign appearance, asymptomatic. Differential includes benign cyst versus malignancy, though appearance not suggestive of cancer. Patient with  significant anxiety regarding the potential for malignancy and wants to have it addressed.  - Discussed options including observation, biopsy, and tonsillectomy - Patient would like to consider biopsy or tonsillectomy - After discussion with daughters she has decided to proceed with in office biopsy - Patient is not on blood thinners - advised to refrain from taking any blood thinning supplements prior to procedure   Adah Malkin, DO [facility]

## 2024-09-29 NOTE — Telephone Encounter (Signed)
 The patient called in and has decided she would like to do a biopsy of her tonsil instead of just scheduling surgical removal. Please advise on next steps, she would be ready to have that biopsy done as soon as you can schedule her if you want to do that in office. Please advise patient.

## 2024-10-03 ENCOUNTER — Other Ambulatory Visit (INDEPENDENT_AMBULATORY_CARE_PROVIDER_SITE_OTHER): Payer: Self-pay

## 2024-10-03 ENCOUNTER — Telehealth (INDEPENDENT_AMBULATORY_CARE_PROVIDER_SITE_OTHER): Payer: Self-pay

## 2024-10-03 DIAGNOSIS — J358 Other chronic diseases of tonsils and adenoids: Secondary | ICD-10-CM

## 2024-10-03 NOTE — Telephone Encounter (Signed)
 I spoke w/pt to inform that Dr. Mila has put orders in for her surgery. Pt stated she has already been called and scheduled.

## 2024-10-03 NOTE — Telephone Encounter (Signed)
 The patient has decided she would like to have her tonsils removed, and she is hoping that Dr Mila can do this before she goes out on materinty leave please.

## 2024-10-10 ENCOUNTER — Ambulatory Visit (INDEPENDENT_AMBULATORY_CARE_PROVIDER_SITE_OTHER)

## 2024-10-15 ENCOUNTER — Other Ambulatory Visit: Payer: Self-pay

## 2024-10-15 ENCOUNTER — Encounter (HOSPITAL_COMMUNITY): Payer: Self-pay

## 2024-10-15 NOTE — Anesthesia Preprocedure Evaluation (Addendum)
 Anesthesia Evaluation  Patient identified by MRN, date of birth, ID band Patient awake    Reviewed: Allergy & Precautions, H&P , NPO status , Patient's Chart, lab work & pertinent test results  Airway Mallampati: III  TM Distance: >3 FB Neck ROM: Full    Dental no notable dental hx. (+) Teeth Intact, Dental Advisory Given   Pulmonary sleep apnea (hypoglossal nerve stim 2020)   noted to have a cyst on her tonsil during ENT evaluation in [place] for hearing issues. She noted weakening of her voice with frequent throat clearing. She was concerned it could represent malignancy and desired biopsy or tonsillectomy to tissue diagnosis and management.    Pulmonary exam normal breath sounds clear to auscultation       Cardiovascular hypertension (153/63 preop, per pt normally 140s SBP), Pt. on medications Normal cardiovascular exam Rhythm:Regular Rate:Normal     Neuro/Psych  Headaches  negative psych ROS   GI/Hepatic Neg liver ROS,GERD  Medicated and Controlled,,  Endo/Other  diabetes (diet controlled)    Renal/GU CRFRenal disease (known CKD 3)  negative genitourinary   Musculoskeletal  (+) Arthritis , Osteoarthritis,    Abdominal   Peds negative pediatric ROS (+)  Hematology negative hematology ROS (+)   Anesthesia Other Findings TMJ  Reproductive/Obstetrics negative OB ROS                              Anesthesia Physical Anesthesia Plan  ASA: 2  Anesthesia Plan: General   Post-op Pain Management: Tylenol  PO (pre-op)*   Induction: Intravenous  PONV Risk Score and Plan: 4 or greater and Ondansetron , Dexamethasone and Treatment may vary due to age or medical condition  Airway Management Planned: Oral ETT  Additional Equipment: None  Intra-op Plan:   Post-operative Plan: Extubation in OR  Informed Consent: I have reviewed the patients History and Physical, chart, labs and discussed the  procedure including the risks, benefits and alternatives for the proposed anesthesia with the patient or authorized representative who has indicated his/her understanding and acceptance.     Dental advisory given  Plan Discussed with: CRNA  Anesthesia Plan Comments: ( )         Anesthesia Quick Evaluation

## 2024-10-15 NOTE — Progress Notes (Signed)
 Anesthesia Chart Review: Sandra Leblanc  Case: [id number] Date/Time: 10/16/24 1052   Procedure: TONSILLECTOMY (Bilateral)   Anesthesia type: General   Diagnosis: Cyst of tonsil [J35.8]   Pre-op diagnosis: Cyst of tonsil   [facility]   Surgeons: Mila Adah SAUNDERS, DO       DISCUSSION: Patient is an 78 year old female scheduled for the above procedure. By notes, she was noted to have a cyst on her tonsil during ENT evaluation in [place] for hearing issues. She noted weakening of her voice with frequent throat clearing. She was concerned it could represent malignancy and desired biopsy or tonsillectomy to tissue diagnosis and management.   History includes never smoker, HTN, hypercholesterolemia, coronary atherosclerosis (by imaging?), DM2, sleep apnea (obstructive and central, s/p hypoglossal nerve stimulator implant at [facility] 05/28/2019), CKD (stage III), GERD, TMJ dysfunction, skin cancer (BCC).   Last visit was cardiologist Dr. Michele was on 09/24/2023 to establish care for HTN and mild valvular disease on 2020 echo. She desired to make some lifestyle changes prior to making any additional medication adjustments. TTE updated 10/2023 and showed LVEF 60 to 65%, no regional wall motion abnormalities, grade 1 diastolic dysfunction, normal RV systolic function, trivial MR. Follow-up ~ 1 year planned. Meds currently include Lipitor, fish oil, irbesartan.   Anesthesia team to evaluate on the day of surgery. She is for updated labs and EKG on arrival.   VS: Ht 5' 1 (1.549 m)   Wt 64.9 kg   BMI 27.02 kg/m  BP Readings from Last 3 Encounters:  09/29/24 (!) 167/76  09/24/23 (!) 140/68  08/08/22 135/84   Pulse Readings from Last 3 Encounters:  09/29/24 (!) 58  09/24/23 (!) 57  08/08/22 75     PROVIDERS: Ezzard Olam NOVAK, NP is PCP  Michele Richardson, DO is cardiologist   LABS: For day of surgery as indicated. As of August 2024 HGB 14.3, Cr 1.04, glucose 107, AST l13, ALT 26.     EKG: 09/24/2023: Sinus bradycardia at 57 bpm When compared with ECG of 10-Feb-2013 13:10, No significant change was found Confirmed by Michele Richardson [phone]) on 09/24/2023 2:12:08 PM   CV: Echo 10/19/2023: IMPRESSIONS   1. Left ventricular ejection fraction, by estimation, is 60 to 65%. The  left ventricle has normal function. The left ventricle has no regional  wall motion abnormalities. Left ventricular diastolic parameters are  consistent with Grade I diastolic  dysfunction (impaired relaxation).   2. Right ventricular systolic function is normal. The right ventricular  size is normal. Tricuspid regurgitation signal is inadequate for assessing  PA pressure.   3. The mitral valve is normal in structure. Trivial mitral valve  regurgitation. No evidence of mitral stenosis.   4. The aortic valve is tricuspid. There is mild calcification of the  aortic valve. Aortic valve regurgitation is not visualized. No aortic  stenosis is present.   5. The inferior vena cava is normal in size with greater than 50%  respiratory variability, suggesting right atrial pressure of 3 mmHg.   She denied prior stress test or cardiac cath.    Past Medical History:  Diagnosis Date   Allergic rhinitis    Arthritis    Cancer (HCC)    basal cell carcinoma   Central sleep apnea    Chronic kidney disease (CKD), stage III (moderate) (HCC)    Congenital abnormality of nipple    Coronary atherosclerosis    Decreased estrogen level    DM (diabetes  mellitus), type 2 with complications (HCC)    GERD (gastroesophageal reflux disease)    Hypercholesterolemia    Hypertension    Hypertriglyceridemia    Migraine    Mixed hyperlipidemia    OSA (obstructive sleep apnea)    Osteoporosis    Psychophysiologic insomnia    Sleep apnea    Temporomandibular joint pain-dysfunction syndrome    Vitamin D deficiency     Past Surgical History:  Procedure Laterality Date   BLADDER SURGERY     BREAST REDUCTION  SURGERY     BREAST SURGERY     COLONOSCOPY WITH PROPOFOL  N/A 02/13/2013   Procedure: COLONOSCOPY WITH PROPOFOL ;  Surgeon: Lamar CHRISTELLA Hollingshead, MD;  Location: [facility];  Service: Endoscopy;  Laterality: N/A;  start 0954; in cecum at 1003 ; out of cecum at 1020  ; total time = 17 minutes   ECTOPIC PREGNANCY SURGERY     ESOPHAGOGASTRODUODENOSCOPY (EGD) WITH PROPOFOL  N/A 02/13/2013   Procedure: ESOPHAGOGASTRODUODENOSCOPY (EGD) WITH PROPOFOL ;  Surgeon: Lamar CHRISTELLA Hollingshead, MD;  Location: [facility];  Service: Endoscopy;  Laterality: N/A;  end 9051   MALONEY DILATION N/A 02/13/2013   Procedure: AGAPITO DILATION;  Surgeon: Lamar CHRISTELLA Hollingshead, MD;  Location: [facility];  Service: Endoscopy;  Laterality: N/A;  54mm    MEDICATIONS: No current facility-administered medications for this encounter.    atorvastatin (LIPITOR) 20 MG tablet   Cholecalciferol 50 MCG (2000 UT) TABS   cyclobenzaprine (FLEXERIL) 5 MG tablet   estradiol (ESTRACE) 0.5 MG tablet   fish oil-omega-3 fatty acids 1000 MG capsule   irbesartan (AVAPRO) 300 MG tablet   omeprazole  (PRILOSEC) 40 MG capsule    Isaiah Ruder, PA-C Surgical Short Stay/Anesthesiology [facility] Phone [phone] [facility] Phone [phone] 10/15/2024 12:34 PM

## 2024-10-15 NOTE — Progress Notes (Signed)
 PCP - Olam Kerns, PA Cardiologist - Madonna Large, DO  Chest x-ray - n/a EKG - DOS Stress Test - n/a ECHO - 10/19/23 Cardiac Cath - n/a  ICD Pacemaker/Loop - n/a  Sleep Study -  Yes CPAP - No, patient has hypoglossal nerve stimulator implant placed at [facility]    Diabetes Type 2, no meds, diet controlled  Aspirin & Blood Thinner Instructions:  n/a  ERAS -  Anesthesia review: Yes  STOP now taking any Aspirin (unless otherwise instructed by your surgeon), Aleve, Naproxen, Ibuprofen , Motrin , Advil , Goody's, BC's, all herbal medications, fish oil, and all vitamins.   Coronavirus Screening Do you have any of the following symptoms:  Cough yes/no: No Fever (>100.30F)  yes/no: No Runny nose yes/no: No Sore throat yes/no: No Difficulty breathing/shortness of breath  yes/no: No  Have you traveled in the last 14 days and where? yes/no: No  Patient verbalized understanding of instructions that were given via phone.

## 2024-10-16 ENCOUNTER — Encounter (HOSPITAL_COMMUNITY): Admission: RE | Disposition: A | Discharge status: Home / Self Care | Payer: Self-pay | Source: Home / Self Care

## 2024-10-16 ENCOUNTER — Other Ambulatory Visit: Payer: Self-pay

## 2024-10-16 ENCOUNTER — Observation Stay (HOSPITAL_COMMUNITY): Admitting: Vascular Surgery

## 2024-10-16 ENCOUNTER — Observation Stay (HOSPITAL_COMMUNITY): Admission: RE | Admit: 2024-10-16 | Discharge: 2024-10-17 | Disposition: A | Discharge status: Home / Self Care

## 2024-10-16 ENCOUNTER — Encounter (HOSPITAL_COMMUNITY): Payer: Self-pay

## 2024-10-16 DIAGNOSIS — E1122 Type 2 diabetes mellitus with diabetic chronic kidney disease: Secondary | ICD-10-CM | POA: Insufficient documentation

## 2024-10-16 DIAGNOSIS — G4733 Obstructive sleep apnea (adult) (pediatric): Principal | ICD-10-CM | POA: Insufficient documentation

## 2024-10-16 DIAGNOSIS — J358 Other chronic diseases of tonsils and adenoids: Secondary | ICD-10-CM | POA: Diagnosis present

## 2024-10-16 DIAGNOSIS — Z79899 Other long term (current) drug therapy: Secondary | ICD-10-CM | POA: Diagnosis not present

## 2024-10-16 DIAGNOSIS — N183 Chronic kidney disease, stage 3 unspecified: Secondary | ICD-10-CM | POA: Insufficient documentation

## 2024-10-16 HISTORY — DX: Pneumonia, unspecified organism: J18.9

## 2024-10-16 HISTORY — DX: Malignant (primary) neoplasm, unspecified: C80.1

## 2024-10-16 HISTORY — PX: TONSILLECTOMY: SHX5217

## 2024-10-16 LAB — CBC
HCT: 40.9 % (ref 36.0–46.0)
Hemoglobin: 13.5 g/dL (ref 12.0–15.0)
MCH: 30.2 pg (ref 26.0–34.0)
MCHC: 33 g/dL (ref 30.0–36.0)
MCV: 91.5 fL (ref 80.0–100.0)
Platelets: 187 K/uL (ref 150–400)
RBC: 4.47 MIL/uL (ref 3.87–5.11)
RDW: 12.1 % (ref 11.5–15.5)
WBC: 6.4 K/uL (ref 4.0–10.5)
nRBC: 0 % (ref 0.0–0.2)

## 2024-10-16 LAB — BASIC METABOLIC PANEL WITH GFR
Anion gap: 10 (ref 5–15)
BUN: 17 mg/dL (ref 8–23)
CO2: 21 mmol/L — ABNORMAL LOW (ref 22–32)
Calcium: 9.3 mg/dL (ref 8.9–10.3)
Chloride: 107 mmol/L (ref 98–111)
Creatinine, Ser: 0.89 mg/dL (ref 0.44–1.00)
GFR, Estimated: >60 mL/min (ref >60)
Glucose, Bld: 113 mg/dL — ABNORMAL HIGH (ref 70–99)
Potassium: 3.8 mmol/L (ref 3.5–5.1)
Sodium: 138 mmol/L (ref 135–145)

## 2024-10-16 LAB — GLUCOSE, CAPILLARY
Glucose-Capillary: 126 mg/dL — ABNORMAL HIGH (ref 70–99)
Glucose-Capillary: 105 mg/dL — ABNORMAL HIGH (ref 70–99)
Glucose-Capillary: 112 mg/dL — ABNORMAL HIGH (ref 70–99)

## 2024-10-16 SURGERY — TONSILLECTOMY
Anesthesia: General | Laterality: Bilateral

## 2024-10-16 MED ORDER — OMEGA-3-ACID ETHYL ESTERS 1 G PO CAPS
2.0000 g | ORAL_CAPSULE | Freq: Every day | ORAL | Status: DC
  Administered 2024-10-16 – 2024-10-17 (×2): 2 g via ORAL
  Filled 2024-10-16 (×2): qty 2

## 2024-10-16 MED ORDER — ATORVASTATIN CALCIUM 10 MG PO TABS
20.0000 mg | ORAL_TABLET | Freq: Every day | ORAL | Status: DC
  Administered 2024-10-16 – 2024-10-17 (×2): 20 mg via ORAL
  Filled 2024-10-16 (×2): qty 2

## 2024-10-16 MED ORDER — PHENYLEPHRINE 80 MCG/ML (10ML) SYRINGE FOR IV PUSH (FOR BLOOD PRESSURE SUPPORT)
PREFILLED_SYRINGE | INTRAVENOUS | Status: DC | PRN
  Administered 2024-10-16: 160 ug via INTRAVENOUS

## 2024-10-16 MED ORDER — PROPOFOL 10 MG/ML IV BOLUS
INTRAVENOUS | Status: AC
  Filled 2024-10-16: qty 20

## 2024-10-16 MED ORDER — PROPOFOL 10 MG/ML IV BOLUS
INTRAVENOUS | Status: DC | PRN
  Administered 2024-10-16: 150 mg via INTRAVENOUS
  Administered 2024-10-16: 150 ug/kg/min via INTRAVENOUS

## 2024-10-16 MED ORDER — FENTANYL CITRATE (PF) 100 MCG/2ML IJ SOLN
INTRAMUSCULAR | Status: AC
  Filled 2024-10-16: qty 2

## 2024-10-16 MED ORDER — OMEGA-3 FATTY ACIDS 1000 MG PO CAPS: 2.0000 g | ORAL_CAPSULE | Freq: Every day | ORAL | Status: DC

## 2024-10-16 MED ORDER — IBUPROFEN 400 MG PO TABS: 400.0000 mg | ORAL_TABLET | Freq: Four times a day (QID) | ORAL | Status: DC | PRN

## 2024-10-16 MED ORDER — ROCURONIUM BROMIDE 10 MG/ML (PF) SYRINGE
PREFILLED_SYRINGE | INTRAVENOUS | Status: AC
  Filled 2024-10-16: qty 10

## 2024-10-16 MED ORDER — LACTATED RINGERS IV SOLN: INTRAVENOUS | Status: DC

## 2024-10-16 MED ORDER — SUGAMMADEX SODIUM 200 MG/2ML IV SOLN
INTRAVENOUS | Status: DC | PRN
  Administered 2024-10-16: 200 mg via INTRAVENOUS

## 2024-10-16 MED ORDER — CHLORHEXIDINE GLUCONATE 0.12 % MT SOLN
15.0000 mL | Freq: Once | OROMUCOSAL | Status: AC
  Administered 2024-10-16: 15 mL via OROMUCOSAL
  Filled 2024-10-16: qty 15

## 2024-10-16 MED ORDER — DEXAMETHASONE SOD PHOSPHATE PF 10 MG/ML IJ SOLN
INTRAMUSCULAR | Status: DC | PRN
  Administered 2024-10-16: 10 mg via INTRAVENOUS

## 2024-10-16 MED ORDER — FENTANYL CITRATE (PF) 100 MCG/2ML IJ SOLN: 25.0000 ug | INTRAMUSCULAR | Status: DC | PRN

## 2024-10-16 MED ORDER — DEXMEDETOMIDINE HCL IN NACL 200 MCG/50ML IV SOLN
INTRAVENOUS | Status: DC | PRN
  Administered 2024-10-16: 8 ug via INTRAVENOUS

## 2024-10-16 MED ORDER — CYCLOBENZAPRINE HCL 5 MG PO TABS
5.0000 mg | ORAL_TABLET | Freq: Two times a day (BID) | ORAL | Status: DC
  Administered 2024-10-16 – 2024-10-17 (×2): 5 mg via ORAL
  Filled 2024-10-16 (×2): qty 1

## 2024-10-16 MED ORDER — VITAMIN D 25 MCG (1000 UNIT) PO TABS
2000.0000 [IU] | ORAL_TABLET | ORAL | Status: DC
  Administered 2024-10-16: 2000 [IU] via ORAL
  Filled 2024-10-16: qty 2

## 2024-10-16 MED ORDER — LIDOCAINE 2% (20 MG/ML) 5 ML SYRINGE
INTRAMUSCULAR | Status: DC | PRN
  Administered 2024-10-16: 80 mg via INTRAVENOUS

## 2024-10-16 MED ORDER — ORAL CARE MOUTH RINSE: 15.0000 mL | Freq: Once | OROMUCOSAL | Status: AC

## 2024-10-16 MED ORDER — FENTANYL CITRATE (PF) 250 MCG/5ML IJ SOLN
INTRAMUSCULAR | Status: DC | PRN
Start: 2024-10-16 — End: 2024-10-16
  Administered 2024-10-16 (×3): 50 ug via INTRAVENOUS

## 2024-10-16 MED ORDER — IRBESARTAN 300 MG PO TABS
300.0000 mg | ORAL_TABLET | Freq: Every day | ORAL | Status: DC
  Administered 2024-10-16 – 2024-10-17 (×2): 300 mg via ORAL
  Filled 2024-10-16 (×2): qty 1

## 2024-10-16 MED ORDER — PROPOFOL 500 MG/50ML IV EMUL: INTRAVENOUS | Status: DC | PRN

## 2024-10-16 MED ORDER — ESTRADIOL 0.5 MG PO TABS
0.5000 mg | ORAL_TABLET | Freq: Every day | ORAL | Status: DC
  Administered 2024-10-17: 0.5 mg via ORAL
  Filled 2024-10-16: qty 1

## 2024-10-16 MED ORDER — INSULIN ASPART 100 UNIT/ML IJ SOLN: 0.0000 [IU] | INTRAMUSCULAR | Status: DC | PRN

## 2024-10-16 MED ORDER — ESMOLOL HCL 100 MG/10ML IV SOLN
INTRAVENOUS | Status: AC
  Filled 2024-10-16: qty 10

## 2024-10-16 MED ORDER — ACETAMINOPHEN 500 MG PO TABS
1000.0000 mg | ORAL_TABLET | Freq: Once | ORAL | Status: AC
  Administered 2024-10-16: 1000 mg via ORAL
  Filled 2024-10-16: qty 2

## 2024-10-16 MED ORDER — 0.9 % SODIUM CHLORIDE (POUR BTL) OPTIME
TOPICAL | Status: DC | PRN
  Administered 2024-10-16: 1000 mL

## 2024-10-16 MED ORDER — ONDANSETRON HCL 4 MG/2ML IJ SOLN
INTRAMUSCULAR | Status: AC
  Filled 2024-10-16: qty 2

## 2024-10-16 MED ORDER — ONDANSETRON HCL 4 MG/2ML IJ SOLN
INTRAMUSCULAR | Status: DC | PRN
  Administered 2024-10-16: 4 mg via INTRAVENOUS

## 2024-10-16 MED ORDER — TRAMADOL HCL 50 MG PO TABS
50.0000 mg | ORAL_TABLET | Freq: Four times a day (QID) | ORAL | Status: DC | PRN
  Administered 2024-10-16: 50 mg via ORAL
  Filled 2024-10-16: qty 1

## 2024-10-16 MED ORDER — ONDANSETRON HCL 4 MG/2ML IJ SOLN: 4.0000 mg | Freq: Once | INTRAMUSCULAR | Status: DC | PRN

## 2024-10-16 MED ORDER — ROCURONIUM BROMIDE 10 MG/ML (PF) SYRINGE
PREFILLED_SYRINGE | INTRAVENOUS | Status: DC | PRN
  Administered 2024-10-16: 50 mg via INTRAVENOUS
  Administered 2024-10-16: 20 mg via INTRAVENOUS

## 2024-10-16 MED ORDER — LIDOCAINE 2% (20 MG/ML) 5 ML SYRINGE
INTRAMUSCULAR | Status: AC
  Filled 2024-10-16: qty 5

## 2024-10-16 MED ORDER — PANTOPRAZOLE SODIUM 40 MG PO TBEC
40.0000 mg | DELAYED_RELEASE_TABLET | Freq: Every day | ORAL | Status: DC
  Administered 2024-10-17: 40 mg via ORAL
  Filled 2024-10-16: qty 1

## 2024-10-16 SURGICAL SUPPLY — 25 items
BAG COUNTER SPONGE SURGICOUNT (BAG) ×1 IMPLANT
CANISTER SUCTION 3000ML PPV (SUCTIONS) ×1 IMPLANT
CATH ROBINSON RED A/P 10FR (CATHETERS) IMPLANT
CLEANER TIP ELECTROSURG 2X2 (MISCELLANEOUS) ×1 IMPLANT
COAGULATOR SUCT SWTCH 10FR 6 (ELECTROSURGICAL) ×1 IMPLANT
ELECT COATED BLADE 2.86 ST (ELECTRODE) ×1 IMPLANT
ELECTRODE REM PT RETRN 9FT PED (ELECTROSURGICAL) IMPLANT
ELECTRODE REM PT RTRN 9FT ADLT (ELECTROSURGICAL) IMPLANT
GAUZE 4X4 16PLY ~~LOC~~+RFID DBL (SPONGE) ×1 IMPLANT
GLOVE BIO SURGEON STRL SZ7 (GLOVE) ×1 IMPLANT
GOWN STRL REUS W/ TWL LRG LVL3 (GOWN DISPOSABLE) ×2 IMPLANT
KIT BASIN OR (CUSTOM PROCEDURE TRAY) ×1 IMPLANT
KIT TURNOVER KIT B (KITS) ×1 IMPLANT
PACK SRG BSC III STRL LF ECLPS (CUSTOM PROCEDURE TRAY) ×1 IMPLANT
PAD ARMBOARD POSITIONER FOAM (MISCELLANEOUS) IMPLANT
PENCIL SMOKE EVACUATOR (MISCELLANEOUS) IMPLANT
POSITIONER HEAD DONUT 9IN (MISCELLANEOUS) ×1 IMPLANT
SOLN 0.9% NACL POUR BTL 1000ML (IV SOLUTION) ×1 IMPLANT
SPONGE TONSIL 1.25 RF SGL STRG (GAUZE/BANDAGES/DRESSINGS) ×1 IMPLANT
SYR BULB EAR ULCER 3OZ GRN STR (SYRINGE) ×1 IMPLANT
TOWEL GREEN STERILE FF (TOWEL DISPOSABLE) ×1 IMPLANT
TUBE CONNECTING 12X1/4 (SUCTIONS) ×1 IMPLANT
TUBE SALEM SUMP 16F (TUBING) ×1 IMPLANT
WAND COBLATOR 70 EVAC XTRA (SURGICAL WAND) IMPLANT
YANKAUER SUCT BULB TIP NO VENT (SUCTIONS) ×1 IMPLANT

## 2024-10-16 NOTE — Anesthesia Postprocedure Evaluation (Signed)
 Anesthesia Post Note  Patient: Sandra Leblanc  Procedure(s) Performed: TONSILLECTOMY (Bilateral)     Patient location during evaluation: PACU Anesthesia Type: General Level of consciousness: awake and alert, oriented and patient cooperative Pain management: pain level controlled Vital Signs Assessment: post-procedure vital signs reviewed and stable Respiratory status: spontaneous breathing, nonlabored ventilation and respiratory function stable Cardiovascular status: blood pressure returned to baseline and stable Postop Assessment: no apparent nausea or vomiting Anesthetic complications: no   No notable events documented.  Last Vitals:  Vitals:   10/16/24 1153 10/16/24 1200  BP: (!) 171/70 (!) 167/71  Pulse: 68 69  Resp: 13 12  Temp: (!) 35.8 C   SpO2: 96% 96%    Last Pain:  Vitals:   10/16/24 1215  TempSrc:   PainSc: 0-No pain                 [name]

## 2024-10-16 NOTE — Transfer of Care (Cosign Needed)
 Immediate Anesthesia Transfer of Care Note  Patient: Sandra Leblanc  Procedure(s) Performed: TONSILLECTOMY (Bilateral)  Patient Location: PACU  Anesthesia Type:General  Level of Consciousness: drowsy, patient cooperative, and responds to stimulation  Airway & Oxygen Therapy: Patient Spontanous Breathing and Patient connected to face mask  Post-op Assessment: Report given to RN, Post -op Vital signs reviewed and stable, and Patient moving all extremities X 4  Post vital signs: Reviewed and stable  Last Vitals:  Vitals Value Taken Time  BP 171/70 10/16/24 11:53  Temp 35.8 C 10/16/24 11:53  Pulse 69 10/16/24 11:59  Resp 12 10/16/24 11:59  SpO2 90 % 10/16/24 11:59  Vitals shown include unfiled device data.  Last Pain:  Vitals:   10/16/24 1153  TempSrc:   PainSc: 0-No pain      Patients Stated Pain Goal: 0 (10/16/24 0859)  Complications: No notable events documented.

## 2024-10-16 NOTE — Op Note (Signed)
 Date of Surgery: 10/16/24 Surgeon: Adah Malkin DO Type of Anesthesia: General Endotracheal Preoperative Diagnosis: Tonsillar cyst, OSA Postoperative Diagnosis: same Procedure(s): Tonsillectomy Indications for Procedure:  78 year old female who presented to clinic with left tonsillar cyst. Patient was very concerned about this cyst and we had a lengthy discussion regarding biopsy vs excision vs observation. Patient elected to proceed with excision. Patient made aware of all risks, benefits, and alternatives of tonsillectomy.  Estimated Blood Loss:  minimal Drains and/or Packs: No drains or packs Significant Events: No significant adverse events Findings-  - Bilateral endophytic palatine tonsils - Left tonsil with cyst at the posterior superior pole Procedure Details:  Patient was taken to surgery induced with general anesthesia and intubated.  Shoulder roll was placed to provide neck extension.  Patient was then properly prepped and draped.  Crowe-Davis mouth gag was inserted into the mouth and patient was suspended from the Mayo stand. Attention was first drawn to the right tonsil it was grasped with a right Allis forcep and the tonsil was retracted medially. The tonsil was dissected free using the Coblator wand at a setting of 7 and any bleeders that were encountered were cauterized with the cautery function at a setting of 3.   Attention was then drawn to the left tonsil it was grasped with an Allis forcep and the tonsil was retracted medially. The tonsil was dissected free using the Coblator wand at a setting of 7 and any bleeders that were encountered were cauterized with the cautery function at a setting of 3. The tonsils were sent separately for pathology. A red rubber catheter was used to suspend the soft palate and the adenoids were noted to be absent. The red rubber catheter was removed from the patient. The mouth gag was released from suspension and patient was allowed to rest for 1 minute.  Any additional bleeders that were encountered were controlled with cautery. All instrumentation was removed and patient was sent to recovery in satisfactory condition. Implants: No implants inserted Complications: none Specimen(s) Removed: Bilateral palatine tonsils Post-Op Condition of Patient: stable  Adah Malkin, DO [facility]

## 2024-10-16 NOTE — H&P (Signed)
 Sandra Leblanc is an 78 y.o. female.    Chief Complaint:  tonsillar cyst  HPI: Patient presents today for planned elective procedure.  She denies any interval change in history since office visit on 09/29/2024. Patient states she would like to stay in the hospital for one night to ensure adequate recovery post-op.   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   Central sleep apnea    Chronic kidney disease (CKD), stage III (moderate) (HCC)    Congenital abnormality of nipple    Coronary atherosclerosis    Decreased estrogen level    DM (diabetes mellitus), type 2 with complications (HCC)    no meds, does not check blood sugar, diet controlled   GERD (gastroesophageal reflux disease)    Hypercholesterolemia    Hypertension    Hypertriglyceridemia    Migraine    Mixed hyperlipidemia    OSA (obstructive sleep apnea)    CPAP - No, patient has hypoglossal nerve stimulator implant placed at [facility]   Osteoporosis    Pneumonia    as a child   Psychophysiologic insomnia    Sleep apnea    Temporomandibular joint pain-dysfunction syndrome    Vitamin D deficiency     Past Surgical History:  Procedure Laterality Date   BREAST REDUCTION SURGERY     BREAST SURGERY Right    nipple   COLONOSCOPY WITH PROPOFOL  N/A 02/13/2013   Procedure: COLONOSCOPY WITH PROPOFOL ;  Surgeon: Lamar CHRISTELLA Hollingshead, MD;  Location: [facility];  Service: Endoscopy;  Laterality: N/A;  start 0954; in cecum at 1003 ; out of cecum at 1020  ; total time = 17 minutes   ECTOPIC PREGNANCY SURGERY     ESOPHAGOGASTRODUODENOSCOPY (EGD) WITH PROPOFOL  N/A 02/13/2013   Procedure: ESOPHAGOGASTRODUODENOSCOPY (EGD) WITH PROPOFOL ;  Surgeon: Lamar CHRISTELLA Hollingshead, MD;  Location: [facility];  Service: Endoscopy;  Laterality: N/A;  end [id number]   EYE SURGERY Bilateral    cataracts   MALONEY DILATION N/A 02/13/2013   Procedure: AGAPITO DILATION;  Surgeon: Lamar CHRISTELLA Hollingshead,  MD;  Location: [facility];  Service: Endoscopy;  Laterality: N/A;  54mm    Family History  Problem Relation Age of Onset   Hypertension Mother    Arthritis Mother    Liver cancer Father    Colon cancer Neg Hx     Social History:  reports that she has never smoked. She has never used smokeless tobacco. She reports that she does not currently use alcohol. She reports that she does not use drugs.  Allergies:  Allergies  Allergen Reactions   Percocet [Oxycodone-Acetaminophen ] Hives and Rash    Medications Prior to Admission  Medication Sig Dispense Refill   atorvastatin (LIPITOR) 20 MG tablet Take 20 mg by mouth daily.      Cholecalciferol 50 MCG (2000 UT) TABS Take 1 tablet by mouth every other day.     cyclobenzaprine (FLEXERIL) 5 MG tablet Take 5 mg by mouth 2 (two) times daily.     estradiol (ESTRACE) 0.5 MG tablet Take 0.5 mg by mouth daily.     fish oil-omega-3 fatty acids 1000 MG capsule Take 2 g by mouth daily.     irbesartan (AVAPRO) 300 MG tablet Take 300 mg by mouth daily.     omeprazole  (PRILOSEC) 40 MG capsule TAKE 1 CAPSULE BY MOUTH EVERY DAY (Patient taking differently: Take 40 mg by mouth daily  as needed (reflux).) 30 capsule 11    Results for orders placed or performed during the hospital encounter of 10/16/24 (from the past 48 hours)  Glucose, capillary     Status: Abnormal   Collection Time: 10/16/24  8:38 AM  Result Value Ref Range   Glucose-Capillary 126 (H) 70 - 99 mg/dL    Comment: Glucose reference range applies only to samples taken after fasting for at least 8 hours.   Comment 1 Notify RN    Comment 2 Document in Chart   CBC per protocol     Status: None   Collection Time: 10/16/24  9:15 AM  Result Value Ref Range   WBC 6.4 4.0 - 10.5 K/uL   RBC 4.47 3.87 - 5.11 MIL/uL   Hemoglobin 13.5 12.0 - 15.0 g/dL   HCT 59.0 63.9 - 53.9 %   MCV 91.5 80.0 - 100.0 fL   MCH 30.2 26.0 - 34.0 pg   MCHC 33.0 30.0 - 36.0 g/dL   RDW 87.8 88.4 - 84.4 %   Platelets 187  150 - 400 K/uL   nRBC 0.0 0.0 - 0.2 %    Comment: Performed at [facility], [address], [place], [place] [address]   No results found.  ROS: ROS  Blood pressure (!) 153/63, pulse (!) 56, temperature 98.2 F (36.8 C), temperature source Oral, resp. rate 18, height 5' 1 (1.549 m), weight 63.5 kg, SpO2 96%.   Assessment/Plan Proceed to OR for tonsillectomy with possible adenoidectomy    Cherice Glennie R Ketura Sirek 10/16/2024, 9:26 AM

## 2024-10-16 NOTE — Plan of Care (Signed)
  Problem: Clinical Measurements: Goal: Ability to maintain clinical measurements within normal limits will improve Outcome: Progressing   Problem: Activity: Goal: Risk for activity intolerance will decrease Outcome: Progressing   Problem: Elimination: Goal: Will not experience complications related to bowel motility Outcome: Progressing   Problem: Pain Managment: Goal: General experience of comfort will improve and/or be controlled Outcome: Progressing   Problem: Safety: Goal: Ability to remain free from injury will improve Outcome: Progressing

## 2024-10-17 ENCOUNTER — Encounter (HOSPITAL_COMMUNITY): Payer: Self-pay

## 2024-10-17 DIAGNOSIS — J358 Other chronic diseases of tonsils and adenoids: Secondary | ICD-10-CM | POA: Diagnosis not present

## 2024-10-17 MED ORDER — TRAMADOL HCL 50 MG PO TABS: 50.0000 mg | ORAL_TABLET | Freq: Four times a day (QID) | ORAL | 0 refills | Status: DC | PRN

## 2024-10-17 NOTE — Discharge Summary (Signed)
 Discharge Summary  Attending Physician: Mila Adah SAUNDERS, DO  Date of Admission: 10/16/2024  Date of Discharge: 10/17/24  Diagnoses on Admission Order:  Cyst of tonsil [J35.8] OSA (obstructive sleep apnea) [facility] Principal Problem (Discharge Diagnoses):  OSA (obstructive sleep apnea)  Procedures Performed: Tonsillectomy  Hospital Course:  This is a 78 y.o. female who was seen by Dr. Mila for tonsillar cyst. The patient arrive to the hospital on 10/16/2024 for tonsillectomy. Benefits and risks of procedure were discussed, including bleeding, infection, injury to nearby structures, pain, scar, and need for further procedures; all questions sought and answered, and written consent was obtained.The operation was performed without complication and the patient had an uneventful recovery. Postoperatively patient's diet was gradually advanced as tolerated. Patient's pain remained well-controlled.  Please see the medical chart for further details regarding patient's hospitalization.  Evaluation on Day of Discharge:  The patient was seen and examined this morning, 10/17/24, and noted to have good oral intake without nausea or emesis, voiding without difficulty, and ambulating at baseline. The patient's pain is well controlled.The patient denies any fever, chills, shortness of breath, difficulty breathing, chest pain or palpitations. The patient is ready to be discharged and is expected to do well in their post-operative recovery. All questions and concerns were address and answered prior to discharge.The patient has been hemodynamically stable and appears appropriate for discharge. They have been instructed to follow up in the office with the surgeon as instructed in their discharge packet. Patient was agreeable to discharge home.    30 min of time was spent counseling the patient at discharge.    Adah Mila, DO [place] [facility]

## 2024-10-17 NOTE — Plan of Care (Signed)

## 2024-10-20 LAB — SURGICAL PATHOLOGY

## 2024-10-23 ENCOUNTER — Telehealth (INDEPENDENT_AMBULATORY_CARE_PROVIDER_SITE_OTHER): Payer: Self-pay

## 2024-10-23 ENCOUNTER — Other Ambulatory Visit (INDEPENDENT_AMBULATORY_CARE_PROVIDER_SITE_OTHER): Payer: Self-pay

## 2024-10-23 DIAGNOSIS — G8918 Other acute postprocedural pain: Secondary | ICD-10-CM

## 2024-10-23 DIAGNOSIS — J358 Other chronic diseases of tonsils and adenoids: Secondary | ICD-10-CM

## 2024-10-23 MED ORDER — TRAMADOL HCL 50 MG PO TABS: 50.0000 mg | ORAL_TABLET | Freq: Three times a day (TID) | ORAL | 0 refills | Status: AC | PRN

## 2024-10-23 NOTE — Telephone Encounter (Signed)
 I spoke w/pt after she LVM and stated she is in pain and needs more pain medication., she is having a lot of pain. I informed pt that she can alternate Ibuprofen  and Tylenol  every 4 hours and up to 800 mg of each. Gargle easy w/some warm salt water  and drink lots of fluids. Pt stated that she had some very green chunky drainage for a few days, the last was 2 days ago. I informed pt that I will address w/Dr Mila and see if she advises differently and one of us  will call her back.

## 2024-10-23 NOTE — Telephone Encounter (Signed)
 I return call to pt and informed her per DR. Dharap Have her gargle cold water  and I will send more tramadol  pain medication

## 2024-10-23 NOTE — Telephone Encounter (Signed)
 I called pt back and informed her that per Dr. Mila, Have her gargle cold water  and I will send more tramadol  pain medication . Pt understood.

## 2024-11-03 ENCOUNTER — Ambulatory Visit (INDEPENDENT_AMBULATORY_CARE_PROVIDER_SITE_OTHER)

## 2024-11-03 VITALS — BP 154/78 | HR 52 | Temp 98.0°F | Wt 139.0 lb

## 2024-11-03 DIAGNOSIS — J358 Other chronic diseases of tonsils and adenoids: Secondary | ICD-10-CM

## 2024-11-03 DIAGNOSIS — Z09 Encounter for follow-up examination after completed treatment for conditions other than malignant neoplasm: Secondary | ICD-10-CM

## 2024-11-03 NOTE — Progress Notes (Signed)
 HPI:   Sandra Leblanc is a 78 y.o. female who presents s/p tonsillectomy for tonsillar cyst. No complaints at this time. Post-op recovery was uneventful. Pathology reviewed with patient showing benign tonsillar cyst. No new complaints.   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   Central sleep apnea    Chronic kidney disease (CKD), stage III (moderate) (HCC)    Congenital abnormality of nipple    Coronary atherosclerosis    Decreased estrogen level    DM (diabetes mellitus), type 2 with complications (HCC)    no meds, does not check blood sugar, diet controlled   GERD (gastroesophageal reflux disease)    Hypercholesterolemia    Hypertension    Hypertriglyceridemia    Migraine    Mixed hyperlipidemia    OSA (obstructive sleep apnea)    CPAP - No, patient has hypoglossal nerve stimulator implant placed at [facility]   Osteoporosis    Pneumonia    as a child   Psychophysiologic insomnia    Sleep apnea    Temporomandibular joint pain-dysfunction syndrome    Vitamin D  deficiency    Past Surgical History:  Procedure Laterality Date   BREAST REDUCTION SURGERY     BREAST SURGERY Right    nipple   COLONOSCOPY WITH PROPOFOL  N/A 02/13/2013   Procedure: COLONOSCOPY WITH PROPOFOL ;  Surgeon: Lamar CHRISTELLA Hollingshead, MD;  Location: [facility];  Service: Endoscopy;  Laterality: N/A;  start 0954; in cecum at 1003 ; out of cecum at 1020  ; total time = 17 minutes   ECTOPIC PREGNANCY SURGERY     ESOPHAGOGASTRODUODENOSCOPY (EGD) WITH PROPOFOL  N/A 02/13/2013   Procedure: ESOPHAGOGASTRODUODENOSCOPY (EGD) WITH PROPOFOL ;  Surgeon: Lamar CHRISTELLA Hollingshead, MD;  Location: [facility];  Service: Endoscopy;  Laterality: N/A;  end [id number]   EYE SURGERY Bilateral    cataracts   MALONEY DILATION N/A 02/13/2013   Procedure: AGAPITO DILATION;  Surgeon: Lamar CHRISTELLA Hollingshead, MD;  Location: [facility];  Service: Endoscopy;   Laterality: N/A;  54mm   TONSILLECTOMY Bilateral 10/16/2024   Procedure: TONSILLECTOMY;  Surgeon: Mila Adah SAUNDERS, DO;  Location: [facility];  Service: ENT;  Laterality: Bilateral;   No family history of bleeding disorders, wound healing problems or difficulty with anesthesia.  Social Connections: Moderately Isolated (10/16/2024)   Social Connection and Isolation Panel    Frequency of Communication with Friends and Family: Once a week    Frequency of Social Gatherings with Friends and Family: Once a week    Attends Religious Services: 1 to 4 times per year    Active Member of [employer] or Organizations: Yes    Attends Banker Meetings: 1 to 4 times per year    Marital Status: Divorced    Current Outpatient Medications:    atorvastatin  (LIPITOR) 20 MG tablet, Take 20 mg by mouth daily. , Disp: , Rfl:    Cholecalciferol  50 MCG (2000 UT) TABS, Take 1 tablet by mouth every other day., Disp: , Rfl:    cyclobenzaprine  (FLEXERIL ) 5 MG tablet, Take 5 mg by mouth 2 (two) times daily., Disp: , Rfl:    estradiol  (ESTRACE ) 0.5 MG tablet, Take 0.5 mg by mouth daily., Disp: , Rfl:    fish oil-omega-3 fatty acids  1000 MG capsule, Take 2 g by mouth daily., Disp: , Rfl:    irbesartan  (AVAPRO ) 300 MG tablet, Take 300 mg by mouth  daily., Disp: , Rfl:    omeprazole  (PRILOSEC) 40 MG capsule, TAKE 1 CAPSULE BY MOUTH EVERY DAY (Patient taking differently: Take 40 mg by mouth daily as needed (reflux).), Disp: 30 capsule, Rfl: 11   traMADol  (ULTRAM ) 50 MG tablet, Take 1 tablet (50 mg total) by mouth every 6 (six) hours as needed., Disp: 20 tablet, Rfl: 0 A complete ROS was performed with pertinent positives/negatives noted in the HPI. The remainder of the ROS are negative.   Physical Exam:  BP (!) 154/78 (BP Location: Left Arm, Patient Position: Sitting, Cuff Size: Normal)   Pulse (!) 52   Temp 98 F (36.7 C) (Oral)   Wt 139 lb (63 kg)   SpO2 98%   BMI 26.26 kg/m  General: Well developed, well  nourished. No acute distress.  Head/Face: Normocephalic. No sinus tenderness. Facial nerve intact and equal bilaterally. No facial lacerations. Eyes: PERRL, no scleral icterus or conjunctival hemorrhage. EOMI. Ears: No gross deformity.  Hearing: Normal speech reception.  Nose: No gross deformity or lesions. No purulent discharge. No turbinate hypertrophy. Mouth/Oropharynx: Lips without any lesions. No mucosal lesions within the oropharynx. Bilateral tonsillar beds healing well. Pharyngeal walls symmetrical. Uvula midline. Tongue midline without lesions. Larynx: See TFL if applicable Nasopharynx: See TFL if applicable Neck: Trachea midline. No masses. No thyromegaly or nodules palpated. No crepitus. Lymphatic: No lymphadenopathy in the neck. Respiratory: No stridor or distress. Room air. Cardiovascular: Regular rate and rhythm. Extremities: No edema or cyanosis. Warm and well-perfused. Skin: No scars or lesions on face or neck. Neurologic: CN II-XII grossly intact. Moving all extremities without gross abnormality. Other:  Independent Review of Additional Tests or Records: None Procedures: None Impression & Plans: Sandra Leblanc is a 78 y.o. female with tonsillar cyst. Patient is now s/p tonsillectomy on 10/16/2024.  Tonsillar cyst - s/p tonsillectomy 10/16/2024 - pathology reviewed  Follow-up as needed  Adah Malkin, DO [place] [facility]

## 2024-11-04 ENCOUNTER — Ambulatory Visit: Admitting: Cardiology

## 2024-12-15 ENCOUNTER — Other Ambulatory Visit: Payer: Self-pay | Admitting: Physician Assistant

## 2024-12-15 DIAGNOSIS — I8393 Asymptomatic varicose veins of bilateral lower extremities: Secondary | ICD-10-CM

## 2024-12-25 ENCOUNTER — Other Ambulatory Visit (INDEPENDENT_AMBULATORY_CARE_PROVIDER_SITE_OTHER): Payer: Self-pay

## 2024-12-25 DIAGNOSIS — G8918 Other acute postprocedural pain: Secondary | ICD-10-CM
